# Patient Record
Sex: Female | Born: 1935 | Race: White | Hispanic: No | Smoking: Former smoker
Health system: Southern US, Community
[De-identification: ages and names within clinical notes are randomized; demographics above are authoritative.]

## PROBLEM LIST (undated history)

## (undated) ENCOUNTER — Emergency Department (HOSPITAL_BASED_OUTPATIENT_CLINIC_OR_DEPARTMENT_OTHER): Admission: EM | Payer: Medicare Other

## (undated) DIAGNOSIS — K2970 Gastritis, unspecified, without bleeding: Secondary | ICD-10-CM

## (undated) DIAGNOSIS — I639 Cerebral infarction, unspecified: Secondary | ICD-10-CM

## (undated) DIAGNOSIS — K648 Other hemorrhoids: Secondary | ICD-10-CM

## (undated) DIAGNOSIS — K635 Polyp of colon: Secondary | ICD-10-CM

## (undated) DIAGNOSIS — F32A Depression, unspecified: Secondary | ICD-10-CM

## (undated) DIAGNOSIS — T7840XA Allergy, unspecified, initial encounter: Secondary | ICD-10-CM

## (undated) DIAGNOSIS — M1990 Unspecified osteoarthritis, unspecified site: Secondary | ICD-10-CM

## (undated) DIAGNOSIS — K210 Gastro-esophageal reflux disease with esophagitis: Secondary | ICD-10-CM

## (undated) DIAGNOSIS — F329 Major depressive disorder, single episode, unspecified: Secondary | ICD-10-CM

## (undated) DIAGNOSIS — C801 Malignant (primary) neoplasm, unspecified: Secondary | ICD-10-CM

## (undated) DIAGNOSIS — K317 Polyp of stomach and duodenum: Secondary | ICD-10-CM

## (undated) DIAGNOSIS — K589 Irritable bowel syndrome without diarrhea: Secondary | ICD-10-CM

## (undated) DIAGNOSIS — G459 Transient cerebral ischemic attack, unspecified: Secondary | ICD-10-CM

## (undated) DIAGNOSIS — K219 Gastro-esophageal reflux disease without esophagitis: Secondary | ICD-10-CM

## (undated) DIAGNOSIS — Z5189 Encounter for other specified aftercare: Secondary | ICD-10-CM

## (undated) DIAGNOSIS — K2960 Other gastritis without bleeding: Secondary | ICD-10-CM

## (undated) HISTORY — PX: TOTAL HIP ARTHROPLASTY: SHX124

## (undated) HISTORY — DX: Irritable bowel syndrome, unspecified: K58.9

## (undated) HISTORY — DX: Gastritis, unspecified, without bleeding: K29.70

## (undated) HISTORY — DX: Transient cerebral ischemic attack, unspecified: G45.9

## (undated) HISTORY — DX: Other gastritis without bleeding: K29.60

## (undated) HISTORY — DX: Depression, unspecified: F32.A

## (undated) HISTORY — DX: Allergy, unspecified, initial encounter: T78.40XA

## (undated) HISTORY — PX: COLONOSCOPY: SHX174

## (undated) HISTORY — DX: Unspecified osteoarthritis, unspecified site: M19.90

## (undated) HISTORY — DX: Malignant (primary) neoplasm, unspecified: C80.1

## (undated) HISTORY — PX: APPENDECTOMY: SHX54

## (undated) HISTORY — DX: Gastro-esophageal reflux disease without esophagitis: K21.9

## (undated) HISTORY — DX: Polyp of colon: K63.5

## (undated) HISTORY — PX: ESOPHAGOGASTRODUODENOSCOPY: SHX1529

## (undated) HISTORY — DX: Polyp of stomach and duodenum: K31.7

## (undated) HISTORY — DX: Gastro-esophageal reflux disease with esophagitis: K21.0

## (undated) HISTORY — DX: Other hemorrhoids: K64.8

## (undated) HISTORY — DX: Major depressive disorder, single episode, unspecified: F32.9

## (undated) HISTORY — DX: Encounter for other specified aftercare: Z51.89

---

## 1998-03-10 ENCOUNTER — Encounter: Payer: Self-pay | Admitting: Orthopedic Surgery

## 1998-03-12 ENCOUNTER — Inpatient Hospital Stay (HOSPITAL_COMMUNITY): Admission: RE | Admit: 1998-03-12 | Discharge: 1998-03-17 | Payer: Self-pay | Admitting: Orthopedic Surgery

## 1998-03-12 ENCOUNTER — Encounter: Payer: Self-pay | Admitting: Orthopedic Surgery

## 1998-03-17 ENCOUNTER — Inpatient Hospital Stay (HOSPITAL_COMMUNITY)
Admission: RE | Admit: 1998-03-17 | Discharge: 1998-03-24 | Payer: Self-pay | Admitting: Physical Medicine and Rehabilitation

## 1998-03-17 ENCOUNTER — Encounter: Payer: Self-pay | Admitting: Physical Medicine and Rehabilitation

## 1998-03-18 ENCOUNTER — Encounter: Payer: Self-pay | Admitting: Physical Medicine and Rehabilitation

## 1998-03-21 ENCOUNTER — Encounter: Payer: Self-pay | Admitting: Physical Medicine and Rehabilitation

## 1998-05-08 ENCOUNTER — Encounter: Admission: RE | Admit: 1998-05-08 | Discharge: 1998-08-06 | Payer: Self-pay | Admitting: Orthopedic Surgery

## 1998-06-17 ENCOUNTER — Ambulatory Visit (HOSPITAL_COMMUNITY): Admission: RE | Admit: 1998-06-17 | Discharge: 1998-06-17 | Payer: Self-pay | Admitting: Cardiology

## 1998-06-17 ENCOUNTER — Encounter: Payer: Self-pay | Admitting: Cardiology

## 1998-06-25 ENCOUNTER — Inpatient Hospital Stay (HOSPITAL_COMMUNITY): Admission: RE | Admit: 1998-06-25 | Discharge: 1998-06-28 | Payer: Self-pay | Admitting: Orthopedic Surgery

## 1998-06-25 ENCOUNTER — Encounter: Payer: Self-pay | Admitting: Orthopedic Surgery

## 1998-06-28 ENCOUNTER — Inpatient Hospital Stay (HOSPITAL_COMMUNITY)
Admission: RE | Admit: 1998-06-28 | Discharge: 1998-07-04 | Payer: Self-pay | Admitting: Physical Medicine & Rehabilitation

## 1998-07-07 ENCOUNTER — Encounter
Admission: RE | Admit: 1998-07-07 | Discharge: 1998-09-04 | Payer: Self-pay | Admitting: Physical Medicine & Rehabilitation

## 1999-06-22 ENCOUNTER — Encounter: Payer: Self-pay | Admitting: Cardiology

## 1999-06-22 ENCOUNTER — Ambulatory Visit (HOSPITAL_COMMUNITY): Admission: RE | Admit: 1999-06-22 | Discharge: 1999-06-22 | Payer: Self-pay | Admitting: Cardiology

## 2001-03-01 DIAGNOSIS — K2950 Unspecified chronic gastritis without bleeding: Secondary | ICD-10-CM

## 2001-03-01 DIAGNOSIS — K2960 Other gastritis without bleeding: Secondary | ICD-10-CM

## 2001-03-01 HISTORY — DX: Other gastritis without bleeding: K29.60

## 2001-03-01 HISTORY — DX: Unspecified chronic gastritis without bleeding: K29.50

## 2001-03-31 ENCOUNTER — Encounter: Payer: Self-pay | Admitting: Internal Medicine

## 2001-03-31 ENCOUNTER — Ambulatory Visit (HOSPITAL_COMMUNITY): Admission: RE | Admit: 2001-03-31 | Discharge: 2001-03-31 | Payer: Self-pay | Admitting: Internal Medicine

## 2001-05-24 ENCOUNTER — Encounter: Admission: RE | Admit: 2001-05-24 | Discharge: 2001-05-24 | Payer: Self-pay | Admitting: Orthopedic Surgery

## 2001-05-24 ENCOUNTER — Encounter: Payer: Self-pay | Admitting: Orthopedic Surgery

## 2001-10-19 ENCOUNTER — Encounter: Admission: RE | Admit: 2001-10-19 | Discharge: 2001-10-19 | Payer: Self-pay | Admitting: Orthopedic Surgery

## 2001-10-19 ENCOUNTER — Encounter: Payer: Self-pay | Admitting: Orthopedic Surgery

## 2002-04-17 ENCOUNTER — Encounter: Payer: Self-pay | Admitting: Internal Medicine

## 2002-04-17 ENCOUNTER — Ambulatory Visit (HOSPITAL_COMMUNITY): Admission: RE | Admit: 2002-04-17 | Discharge: 2002-04-17 | Payer: Self-pay | Admitting: Internal Medicine

## 2003-04-30 ENCOUNTER — Ambulatory Visit (HOSPITAL_COMMUNITY): Admission: RE | Admit: 2003-04-30 | Discharge: 2003-04-30 | Payer: Self-pay | Admitting: *Deleted

## 2004-07-22 ENCOUNTER — Ambulatory Visit (HOSPITAL_COMMUNITY): Admission: RE | Admit: 2004-07-22 | Discharge: 2004-07-22 | Payer: Self-pay | Admitting: Obstetrics and Gynecology

## 2005-01-05 ENCOUNTER — Encounter: Admission: RE | Admit: 2005-01-05 | Discharge: 2005-01-05 | Payer: Self-pay | Admitting: Orthopedic Surgery

## 2005-01-08 ENCOUNTER — Encounter: Admission: RE | Admit: 2005-01-08 | Discharge: 2005-01-08 | Payer: Self-pay | Admitting: Orthopedic Surgery

## 2005-08-10 ENCOUNTER — Ambulatory Visit (HOSPITAL_COMMUNITY): Admission: RE | Admit: 2005-08-10 | Discharge: 2005-08-10 | Payer: Self-pay | Admitting: Internal Medicine

## 2006-09-06 ENCOUNTER — Ambulatory Visit (HOSPITAL_COMMUNITY): Admission: RE | Admit: 2006-09-06 | Discharge: 2006-09-06 | Payer: Self-pay | Admitting: Obstetrics and Gynecology

## 2007-10-05 ENCOUNTER — Ambulatory Visit (HOSPITAL_COMMUNITY): Admission: RE | Admit: 2007-10-05 | Discharge: 2007-10-05 | Payer: Self-pay | Admitting: Obstetrics and Gynecology

## 2008-03-19 ENCOUNTER — Inpatient Hospital Stay (HOSPITAL_COMMUNITY): Admission: AD | Admit: 2008-03-19 | Discharge: 2008-03-20 | Payer: Self-pay | Admitting: Internal Medicine

## 2008-03-19 ENCOUNTER — Ambulatory Visit: Payer: Self-pay | Admitting: Cardiovascular Disease

## 2008-03-20 ENCOUNTER — Ambulatory Visit: Payer: Self-pay | Admitting: Vascular Surgery

## 2008-03-20 ENCOUNTER — Encounter (INDEPENDENT_AMBULATORY_CARE_PROVIDER_SITE_OTHER): Payer: Self-pay | Admitting: Internal Medicine

## 2008-11-07 ENCOUNTER — Ambulatory Visit (HOSPITAL_COMMUNITY): Admission: RE | Admit: 2008-11-07 | Discharge: 2008-11-07 | Payer: Self-pay | Admitting: Internal Medicine

## 2009-03-01 DIAGNOSIS — K2100 Gastro-esophageal reflux disease with esophagitis, without bleeding: Secondary | ICD-10-CM

## 2009-03-01 HISTORY — DX: Gastro-esophageal reflux disease with esophagitis, without bleeding: K21.00

## 2010-03-21 ENCOUNTER — Encounter: Payer: Self-pay | Admitting: Obstetrics and Gynecology

## 2010-04-21 ENCOUNTER — Other Ambulatory Visit (HOSPITAL_COMMUNITY): Payer: Self-pay | Admitting: Internal Medicine

## 2010-04-21 DIAGNOSIS — Z1231 Encounter for screening mammogram for malignant neoplasm of breast: Secondary | ICD-10-CM

## 2010-04-28 ENCOUNTER — Ambulatory Visit (HOSPITAL_COMMUNITY)
Admission: RE | Admit: 2010-04-28 | Discharge: 2010-04-28 | Disposition: A | Discharge status: Home / Self Care | Payer: Medicare Other | Source: Ambulatory Visit | Attending: Internal Medicine | Admitting: Internal Medicine

## 2010-04-28 ENCOUNTER — Encounter (HOSPITAL_COMMUNITY): Payer: Self-pay

## 2010-04-28 DIAGNOSIS — Z1231 Encounter for screening mammogram for malignant neoplasm of breast: Secondary | ICD-10-CM | POA: Insufficient documentation

## 2010-06-15 LAB — COMPREHENSIVE METABOLIC PANEL
Albumin: 4.1 g/dL (ref 3.5–5.2)
Alkaline Phosphatase: 43 U/L (ref 39–117)
BUN: 9 mg/dL (ref 6–23)
CO2: 27 mEq/L (ref 19–32)
Chloride: 101 mEq/L (ref 96–112)
GFR calc non Af Amer: >60 mL/min (ref >60)
Glucose, Bld: 132 mg/dL — ABNORMAL HIGH (ref 70–99)
Potassium: 3.3 mEq/L — ABNORMAL LOW (ref 3.5–5.1)
Total Bilirubin: 0.7 mg/dL (ref 0.3–1.2)

## 2010-06-15 LAB — CBC
HCT: 36 % (ref 36.0–46.0)
Hemoglobin: 12.4 g/dL (ref 12.0–15.0)
RBC: 3.5 MIL/uL — ABNORMAL LOW (ref 3.87–5.11)
WBC: 4.4 10*3/uL (ref 4.0–10.5)

## 2010-06-15 LAB — PROTIME-INR: Prothrombin Time: 13.8 seconds (ref 11.6–15.2)

## 2010-06-15 LAB — LIPID PANEL
Cholesterol: 190 mg/dL (ref 0–200)
HDL: 88 mg/dL (ref >39)
Total CHOL/HDL Ratio: 2.2 RATIO

## 2010-06-15 LAB — DIFFERENTIAL
Basophils Absolute: 0 10*3/uL (ref 0.0–0.1)
Basophils Relative: 1 % (ref 0–1)
Monocytes Absolute: 0.4 10*3/uL (ref 0.1–1.0)
Neutro Abs: 2.6 10*3/uL (ref 1.7–7.7)
Neutrophils Relative %: 58 % (ref 43–77)

## 2010-07-14 NOTE — Consult Note (Signed)
Donna Cummings, Donna Cummings               ACCOUNT NO.:  [id number]   MEDICAL RECORD NO.:  [id number]          PATIENT TYPE:  INP   LOCATION:  3707                         FACILITY:  [facility]   PHYSICIAN:  Melvyn Novas, M.D.  DATE OF BIRTH:  09-Apr-1930   DATE OF CONSULTATION:  03/19/2008  DATE OF DISCHARGE:  03/20/2008                                 CONSULTATION   REFERRING PHYSICIAN:  Mark A. Perini, MD   The patient presents with a sudden onset of left arm weakness, left hand  grip strength loss, and a feeling of numbness throughout the left upper  extremity.   Donna Cummings is a 77 year old Caucasian right-handed female, who was in  her usual state of health until the morning hours of March 18, 2008.  She states that she was dozing off in a comfortable share.  Once she  tried to arise, she noticed that her left hand had no strength.  She distinctly reported that she could not fist her hand, could not hold  anything, and could not lift any object.  This finding was isolated.  She states that there may have been some numbness, but this numbness has  meanwhile resolved, and the weakness is still present.  She was seen in  an outpatient setting by Dr. Waynard Edwards, and he arranged for her to be  admitted as a stroke.  Again, the patient's symptoms are now present for almost 24 hours and  she would not be considered a TIA.   PAST MEDICAL HISTORY:  Based on Dr. Laurey Morale notes.  Colon polyps,  hemorrhoids, constipation, appendectomy, bilateral hip replacements in  2000, 2 pregnancies with normal vaginal deliveries.  No other  complications.  History of gastric ulcer, nonmelanotic skin cancer  diagnosed in 2002, irritable bowel syndrome, lumbar spinal stenosis,  COPD, history of iron-deficiency anemia.   ALLERGIES:  Allergic to PENICILLIN, PREVACID caused diarrhea.   CURRENT MEDICATIONS:  Maxzide p.r.n., multivitamin with iron daily,  calcium with vitamin D daily, vitamin C supplement daily,  over-the-  counter laxatives and Maalox, Prilosec Boniva, and vitamin D.   SOCIAL HISTORY:  The patient is widowed since 1972, have 2 adult sons  and 5 grandchildren.  She has a Master's degree.  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  The patient quits tobacco  use in 1980.  She endorsed about 2 glasses of wine pro dia.  No illegal drug use.   FAMILY HISTORY:  Father died at age 48.  Mother died at 30 and has  dementia, had possibly 1 or 2 strokes.  Her sons are healthy.  Her  sister is at present at the bedside during this interview and also  reports no medical problems in the mutual family members.   PHYSICAL EXAMINATION:  VITAL SIGNS:  The patient's weight is 91 pounds,  blood pressure is 120/60, and pulse is 60.  GENERAL:  She is slender, well groomed, in no acute distress.  NEUROLOGIC:  She ambulates normally.  She speaks clear and fluent.  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  The patient, however, reports no diplopia  and this may have been a compensated lesion for much of her life.  Motor  examination clearly shows left-sided pronator drift, dysmetria on finger-  nose-finger exam, decreased grip strength, decreased elbow flexion  strength, absence of deep tendon reflexes including triceps and biceps.  The patient's left shoulder is droopy.  Lower extremities show no  abnormality, but on the reflex exam, there is an equivocal response to  Babinski stimulation on left and a downgoing response on the right.  The  patient reports preserved sensation to fine touch, filament touch, and  pinprick.  No difference between left and right upper extremities.   ASSESSMENT AND PLAN:  The patient has an isolated left hand weakness,  had some pronator drift and  dysmetria on the left, a possible right  brain stroke likely of a small vessel origin has to be worked up.  I do  not suspect that this patient has a peripheral nerve involvement.  I will also mention the ocular motor differences left verus right eye  with lateral gaze to the left.  An MRI was ordered for tonight and was  pending at the time of my exam.  Again, I expect a very small vascular lesion.  Due to the patient's age,  a demyelinating lesion is much less likely and if Dr. Waynard Edwards agrees, I  would like her to take an enteric-coated baby aspirin daily.  We will check homocysteine levels and fastening lipid profile, .   Dr. Waynard Edwards has arranged for vascular evaluation that includes carotid  duplex studies.  No bruit was noted.       Melvyn Novas, M.D.  Electronically Signed     CD/MEDQ  D:  03/21/2008  T:  03/22/2008  Job:  [id number]

## 2010-07-14 NOTE — H&P (Signed)
NAMEAAVA, DELAND               ACCOUNT NO.:  [id number]   MEDICAL RECORD NO.:  [id number]          PATIENT TYPE:  INP   LOCATION:  3707                         FACILITY:  [facility]   PHYSICIAN:  Mark A. Perini, M.D.   DATE OF BIRTH:  [birth year]   DATE OF ADMISSION:  03/19/2008  DATE OF DISCHARGE:                              HISTORY & PHYSICAL   CHIEF COMPLAINT:  Left arm weakness, left hand weakness.   HISTORY OF PRESENT ILLNESS:  Donna Cummings is a pleasant 77 year old female with  past medical history as listed below.  She was in her usual state of  health until sometime yesterday evening when she was sitting and  reading.  She dozed off for maybe just a couple minutes and when she  woke up her left hand had no strength whatsoever.  She could not make a  fist.  She could not hold things.  She dropped a few things.  She  reports no other stroke symptoms, specifically no speech problems, no  visual problems and no other weakness.  The weakness in her hand has  persisted into today.  She was seen in the office and she is admitted  for evaluation for possible right brain stroke.   PAST MEDICAL HISTORY:  1. Bilateral hip replacement in the year 2000 for osteoarthritis.  2. Colon polyps.  3. Internal hemorrhoids.  4. Constipation.  5. Appendectomy.  6. Postmenopausal since her  early 31s.  7. G2, P2 parity status with both normal vaginal deliveries.  8. Gastric ulcer and gastritis diagnosed in the past.  She has had      nonmelanoma skin cancer diagnosed in 2002.  She has known lumbar      spinal stenosis, irritable bowel syndrome,  COPD noted on plain x-      ray but no clinical diagnosis of COPD and she also has      osteoporosis,  nodal osteoarthritis, anemia and gastroesophageal      reflux.  She took hormone therapy from 1988 until approximately 4      months ago when she discontinued this.   ALLERGIES:  PENICILLIN, NEXIUM CAUSES SIDE EFFECTS AND PREVACID CAUSED  DIARRHEA.   CURRENT MEDICATIONS:  As needed Maxzide; she only takes this once or  twice a month,  multivitamin with iron daily, calcium with D daily,  vitamin C supplement daily, Shaklee Herb-Lax as needed,  Maalox as  needed, Vagifem vaginal suppository twice a week, Prilosec 20 mg daily,  Boniva 150 mg once a month and vitamin D 1000 units daily.   SOCIAL HISTORY:  She had been widowed since 1979.  She is two sons and  five grandsons.  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  She quit tobacco in 1980 after approximately a 30 pack-year  smoking history.  She has about two glasses of wine a night and no drug  use.   FAMILY HISTORY:  Father died at age 70 of an MI.  Mother died at 33 of  stomach problems and diverticulosis and dementia.  She has two older  sisters, one has had breast cancer and one has had uterine cancer.  Her  sons are healthy.   REVIEW OF SYSTEMS:  As per the history present illness.  She has had no  recent fevers.  She denies any paresthesias or pain or numbness in the  affected hand.   PHYSICAL EXAMINATION:  Weight 91 pounds which is stable.  Blood pressure  118/60, pulse 60.  She is in no acute distress and ambulates normally  and speaks normally.  Cranial nerves II-XII are normal.  LUNGS:  Clear to auscultation bilaterally with no wheezes, rales or  rhonchi.  HEART:  Regular rate and rhythm with no murmur or gallop.  ABDOMEN:  Soft and nontender with no mass or hepatosplenomegaly.  Her  left hand has definite decreased grip strength.  She has trouble with  finger-nose-finger testing on the left due to trouble controlling her  left hand.  She has 1+ deep tendon reflexes in both the upper and lower  extremities bilaterally and  her reflex in her left upper extremity  actually seen slightly less than the right and is certainly not  hyperreflexic.  She has no definite pronator drift and noted.   Laboratory data pending.   ASSESSMENT/PLAN:  Left hand  weakness, possibly right brain stroke versus  some sort of nerve impingement syndrome emanating from the neck or  further down the arm.   We will admit her to [facility].  We will check an MRI of her  brain.  We will obtain a neurology consultation.  We will check carotid  Dopplers, lipid profile an echocardiogram as well.  She is a full code  status.  We will also initiate aspirin therapy.      Mark A. Perini, M.D.  Electronically Signed     MAP/MEDQ  D:  03/19/2008  T:  03/19/2008  Job:  [id number]

## 2010-07-17 NOTE — Discharge Summary (Signed)
Donna Cummings, Donna Cummings               ACCOUNT NO.:  [id number]   MEDICAL RECORD NO.:  [id number]          PATIENT TYPE:  INP   LOCATION:  3707                         FACILITY:  [facility]   PHYSICIAN:  Mark A. Perini, M.D.   DATE OF BIRTH:  1930/10/20   DATE OF ADMISSION:  03/19/2008  DATE OF DISCHARGE:  03/20/2008                               DISCHARGE SUMMARY   DISCHARGE DIAGNOSES:  1. Suspected small motor only right brain stroke with the effect of      left upper extremity weakness.  This was not seen on the MRI, but      this was still the suspected cause of her symptoms.  2. Hypokalemia, replaced.  3. Osteoarthritis.  4. Constipation.  5. Remote history of gastric ulcer.   PROCEDURES:  1. Neurology consultation.  MRI of the brain, which showed no acute      abnormality, and was moderate for age, cerebral white matter signal      abnormality most commonly due to chronic small vessel ischemia,      chronic lacunar infarct versus dilated perivascular space was noted      in the anterior left thalamus.  2. A 2-D echocardiogram, which showed normal systolic function with an      ejection fraction of 60%, mild aortic valve regurgitation, mild      mitral valve regurgitation, mildly-dilated left atrium and right      ventricular size at the upper limits of normal, with trivial      posterior effusion, no source of cardiac embolus.  Carotid Dopplers      were performed, which showed a right 60%-80% stenosis, which may      have simply been a tortuous vessel.   DISCHARGE MEDICATIONS:  1. Multivitamin daily.  2. Calcium with D daily.  3. Vagifem suppository as needed.  4. Prilosec 20 mg daily.  5. Aspirin, which is a new medication, noncoated 325 mg daily.  6. Boniva 150 mg once monthly.  7. Vitamin D 1000 units daily.   HISTORY OF PRESENT ILLNESS:  Donna Cummings is a pleasant 77 year old female who  presented to the office after having 12-16 hours of left upper extremity  weakness.  She  noted that she could not hold things and she dropped  several things.  She had no other stroke symptoms.  Due to these  symptoms, she was admitted for evaluation of possible right brain  stroke.   HOSPITAL COURSE:  Donna Cummings was admitted to a telemetry bed.  She had no  arrhythmias noted.  She remained stable from a cardiovascular standpoint  and she had no neurologic worsening.  Her left upper extremity weakness  did improve somewhat during her hospital stay, but was not entirely  resolved.  At the time of discharge, it was felt that she had had a  small right brain motor only infarcts, but simply was too small to show  up on her MRI.  On March 20, 2008, she was discharged home in stable  condition.   DISCHARGE PHYSICAL EXAMINATION:  VITAL SIGNS:  She was afebrile.  Pulse  51, respiratory rate 18, blood pressure 107/55, weight 40.2 kg.  GENERAL:  She was in no acute distress, alert, oriented x4.  LUNGS:  Clear to auscultation bilaterally with no wheezes, rales, or  rhonchi.  HEART:  Regular rate and rhythm with no murmur, rub, or gallop.  ABDOMEN:  Soft, nontender, and nondistended with no mass or  hepatosplenomegaly.  EXTREMITIES:  Left hand was still somewhat weak, but stronger than when  she had upon admission.   DISCHARGE LABORATORY DATA:  Cholesterol 190, triglycerides 31, HDL 88,  LDL 96.  TSH normal at 1.32.  Phosphorus and magnesium were normal.  Potassium was 3.3 on admission and this was replaced.  Liver tests were  normal on admission as were coagulation studies, and her complete blood  count was essentially normal.   DISCHARGE INSTRUCTIONS:  Donna Cummings is to resume her normal activities.  She  is to follow a low-salt, heart-healthy diet.  She is to call if she has  any recurrent problems.  We will set up outpatient occupational therapy  for her left upper extremity weakness.  She is to stay off of her  hormone patch, which she was discontinued several months ago, anyway.  She  will follow up in our office in 2 weeks.      Mark A. Perini, M.D.  Electronically Signed     MAP/MEDQ  D:  04/05/2008  T:  04/06/2008  Job:  [id number]

## 2011-03-12 DIAGNOSIS — D485 Neoplasm of uncertain behavior of skin: Secondary | ICD-10-CM | POA: Diagnosis not present

## 2011-03-12 DIAGNOSIS — L570 Actinic keratosis: Secondary | ICD-10-CM | POA: Diagnosis not present

## 2011-04-30 ENCOUNTER — Encounter: Payer: Self-pay | Admitting: Internal Medicine

## 2011-05-12 ENCOUNTER — Other Ambulatory Visit (HOSPITAL_COMMUNITY): Payer: Self-pay | Admitting: Internal Medicine

## 2011-05-12 DIAGNOSIS — Z1231 Encounter for screening mammogram for malignant neoplasm of breast: Secondary | ICD-10-CM

## 2011-05-18 ENCOUNTER — Ambulatory Visit: Payer: Medicare Other | Admitting: Internal Medicine

## 2011-05-25 ENCOUNTER — Ambulatory Visit (HOSPITAL_COMMUNITY): Payer: Medicare Other

## 2011-05-25 ENCOUNTER — Ambulatory Visit (HOSPITAL_COMMUNITY)
Admission: RE | Admit: 2011-05-25 | Discharge: 2011-05-25 | Disposition: A | Discharge status: Home / Self Care | Payer: Medicare Other | Source: Ambulatory Visit | Attending: Internal Medicine | Admitting: Internal Medicine

## 2011-05-25 DIAGNOSIS — Z1231 Encounter for screening mammogram for malignant neoplasm of breast: Secondary | ICD-10-CM | POA: Insufficient documentation

## 2011-06-10 ENCOUNTER — Telehealth: Payer: Self-pay | Admitting: Internal Medicine

## 2011-06-10 NOTE — Telephone Encounter (Signed)
Received copies from [facility] 06/10/11 . Forwarded  11 pages to Dr. Leone Payor ,for review.

## 2011-06-11 ENCOUNTER — Ambulatory Visit (INDEPENDENT_AMBULATORY_CARE_PROVIDER_SITE_OTHER): Payer: Medicare Other | Admitting: Internal Medicine

## 2011-06-11 ENCOUNTER — Encounter: Payer: Self-pay | Admitting: Internal Medicine

## 2011-06-11 DIAGNOSIS — R198 Other specified symptoms and signs involving the digestive system and abdomen: Secondary | ICD-10-CM | POA: Diagnosis not present

## 2011-06-11 DIAGNOSIS — K589 Irritable bowel syndrome without diarrhea: Secondary | ICD-10-CM | POA: Diagnosis not present

## 2011-06-11 MED ORDER — PEG-KCL-NACL-NASULF-NA ASC-C 100 G PO SOLR: 1.0000 | Freq: Once | ORAL | Status: DC

## 2011-06-11 NOTE — Progress Notes (Signed)
Subjective:    Patient ID: Donna Cummings, female    DOB: 1931/01/30, 78 y.o.   MRN: [id number]  Referring MD: Dr. Rodrigo Ran  HPI This is a delightful 78 year old widowed white woman who presents for evaluation of change in bowel habits. She reports she was moving her bowels regularly though there is some history of alternating loose and hard stools. She does carry a diagnosis of IBS and had previously been receiving gastroenterology care by Dr. Jodi Marble in [place], [place]. She resides in [place] but her son lives in that area so she used to pursue care there. Over decades she has received multiple upper endoscopy and colonoscopy procedures, there is a remote history of colon polyps though none since the early 1990s. Her last colonoscopy was in 2005. He had internal hemorrhoids but no other abnormalities. He had recommended a routine repeat colonoscopy 5 years after that, she did not pursue that because of the TIA around that time it sounds like. She also had problems with bloating and abdominal distention and gastrointestinal upset in the interim and was placed on a proton with success and is still on that. Despite that she has developed decreasing stool caliber and decreasing ability to produce a stool over the past few months. She had been using an Herblax with senna on an intermittent basis but is now taking it everyday to produce regular bowel movements  She thinks there may have been about a 5 pound weight loss associated with that. I do no going back to 2011, she had a weight of 90 pounds. She has had negative biopsies for celiac disease of the duodenum, and I believe celiac panel has been negative in the past though I do not have that information she has not been told she has celiac disease.  She denies any rectal bleeding. She eats quite a bit of fruit and serial. She stays hungry.  Bleeding reported. GI review of systems otherwise negative.  Medications, allergies, past medical  history, past surgical history, family history and social history are reviewed and updated in the EMR.   Review of Systems She traumatized her left posterior calf on her car door and caused abrasion that my nursing staff bandage today. There was some bleeding associated with that. She says it's okay now. She wears eyeglasses. She sleeps well without difficulty. Her energy level is good. 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 She feels like she bleeds easily on aspirin. All other review of systems negative or as per history of present illness.    Objective:   Physical Exam General:  Thin, elderly and in no acute distress Eyes:  anicteric. ENT:   Mouth and posterior pharynx free of lesions.  Neck:   supple w/o thyromegaly or mass.  Lungs: Clear to auscultation bilaterally. Heart:  S1S2, no rubs, murmurs, gallops. Abdomen:  soft, non-tender, no hepatosplenomegaly, hernia, or mass and BS+.  Rectal: Deferred to colonoscopy Lymph:  no cervical or supraclavicular adenopathy. Extremities:   no edema Skin   no rash. Some senile purpura changes in the forearms. The posterior left calf is bandaged. Neuro:  A&O x 3.  Psych:  appropriate mood and  Affect.   Data Reviewed: Previous upper endoscopy, colonoscopy, radiology reports, pathology reports and office notes from prior gastroenterologist. These will be scanned into the electronic medical record.       Assessment & Plan:   1. Change in bowel function   He has a decrease in stool caliber and difficulty  producing a bowel movement that is new. Colonoscopy is appropriate. The risks and benefits as well as alternatives of endoscopic procedure(s) have been discussed and reviewed. All questions answered. The patient agrees to proceed. It will be scheduled at her convenience. She is concerned because she feels like she bleeds easily on aspirin so I will hold her aspirin   2. IBS (irritable bowel syndrome)   She does have a background  of this, it could be the cause of her symptoms but more serious problems need to be excluded.    CC: Ezequiel Kayser, MD

## 2011-06-11 NOTE — Patient Instructions (Signed)
You have been scheduled for a colonoscopy. Please follow written instructions given to you at your visit today.  Please pick up your prep kit at the pharmacy within the next 1-3 days.  Keep your left leg bandaged and follow-up with Dr. Rodrigo Ran about this injury.  Hold your Asprin one week prior to your colonoscopy.

## 2011-06-29 DIAGNOSIS — H04209 Unspecified epiphora, unspecified lacrimal gland: Secondary | ICD-10-CM | POA: Diagnosis not present

## 2011-06-29 DIAGNOSIS — H04129 Dry eye syndrome of unspecified lacrimal gland: Secondary | ICD-10-CM | POA: Diagnosis not present

## 2011-06-29 DIAGNOSIS — H0289 Other specified disorders of eyelid: Secondary | ICD-10-CM | POA: Diagnosis not present

## 2011-07-01 ENCOUNTER — Other Ambulatory Visit: Payer: Medicare Other | Admitting: Internal Medicine

## 2011-07-05 ENCOUNTER — Ambulatory Visit (AMBULATORY_SURGERY_CENTER): Payer: Medicare Other | Admitting: Internal Medicine

## 2011-07-05 ENCOUNTER — Encounter: Payer: Self-pay | Admitting: Internal Medicine

## 2011-07-05 VITALS — BP 115/65 | HR 75 | Temp 97.1°F | Resp 14 | Ht 63.0 in | Wt 86.0 lb

## 2011-07-05 DIAGNOSIS — R198 Other specified symptoms and signs involving the digestive system and abdomen: Secondary | ICD-10-CM | POA: Diagnosis not present

## 2011-07-05 DIAGNOSIS — K644 Residual hemorrhoidal skin tags: Secondary | ICD-10-CM

## 2011-07-05 DIAGNOSIS — D126 Benign neoplasm of colon, unspecified: Secondary | ICD-10-CM

## 2011-07-05 DIAGNOSIS — K648 Other hemorrhoids: Secondary | ICD-10-CM

## 2011-07-05 MED ORDER — SODIUM CHLORIDE 0.9 % IV SOLN: 500.0000 mL | INTRAVENOUS | Status: DC

## 2011-07-05 NOTE — Op Note (Signed)
[facility] [address] [place], [place]  [address]  COLONOSCOPY PROCEDURE REPORT  PATIENT:  Donna Cummings, Donna Cummings  MR#:  [id number] BIRTHDATE:  Oct 04, 1930, 81 yrs. old  GENDER:  female ENDOSCOPIST:  Iva Boop, MD, [facility] REF. BY:  Rodrigo Ran, M.D. PROCEDURE DATE:  07/05/2011 PROCEDURE:  Colonoscopy with snare polypectomy ASA CLASS:  Class II INDICATIONS:  change in bowel habits MEDICATIONS:   These medications were titrated to patient response per physician's verbal order, Fentanyl 50 mcg IV, Versed 5 mg IV  DESCRIPTION OF PROCEDURE:   After the risks benefits and alternatives of the procedure were thoroughly explained, informed consent was obtained.  Digital rectal exam was performed and revealed no rectal masses.  Anal tag present. The LB [id number]-[id number] endoscope was introduced through the anus and advanced to the cecum, which was identified by both the appendix and ileocecal valve, without limitations.  The quality of the prep was good, using MoviPrep.  The instrument was then slowly withdrawn as the colon was fully examined. <<PROCEDUREIMAGES>>  FINDINGS:  A sessile polyp was found in the ascending colon. It was 1 cm in size. Polyp was snared, then cauterized with monopolar cautery. Retrieval was successful.  This was otherwise a normal examination of the colon.   Retroflexed views in the rectum revealed no abnormalities.    The time to cecum = 7:36 minutes. The scope was then withdrawn in 13:10 minutes from the cecum and the procedure completed. COMPLICATIONS:  None ENDOSCOPIC IMPRESSION: 1) 1 cm sessile polyp in the ascending colon - removed 2) Otherwise normal examination with good prep - suspect IBS/spasm cause of bowel habit changes RECOMMENDATIONS: 1) Hold aspirin, aspirin products, and anti-inflammatory medication for 2 weeks. 2) High fiber diet. 3) Add psyllium 1 tablespooon daily if dietary changes unhelpful  4) Follow-up GI as needed REPEAT  EXAM:  May not need routine repeat colonoscopy given her age - await pathology review.  Iva Boop, MD, [facility]  CC:  Rodrigo Ran, MD and The Patient  n. eSIGNED:   Iva Boop at 07/05/2011 10:19 AM  Marjo Bicker, [phone]

## 2011-07-05 NOTE — Progress Notes (Signed)
Pressure applied to abdomen to reach cecum.  

## 2011-07-05 NOTE — Patient Instructions (Signed)
Hold aspirin and aspirin products for 2 weeks.  Hold until Jul 19, 2011.  Resume your other medications today.  Handouts were given on polyps and high fiber diet.  Please call if any questions or concerns.    YOU HAD AN ENDOSCOPIC PROCEDURE TODAY AT THE [facility]: Refer to the procedure report that was given to you for any specific questions about what was found during the examination.  If the procedure report does not answer your questions, please call your gastroenterologist to clarify.  If you requested that your care partner not be given the details of your procedure findings, then the procedure report has been included in a sealed envelope for you to review at your convenience later.  YOU SHOULD EXPECT: Some feelings of bloating in the abdomen. Passage of more gas than usual.  Walking can help get rid of the air that was put into your GI tract during the procedure and reduce the bloating. If you had a lower endoscopy (such as a colonoscopy or flexible sigmoidoscopy) you may notice spotting of blood in your stool or on the toilet paper. If you underwent a bowel prep for your procedure, then you may not have a normal bowel movement for a few days.  DIET: Your first meal following the procedure should be a light meal and then it is ok to progress to your normal diet.  A half-sandwich or bowl of soup is an example of a good first meal.  Heavy or fried foods are harder to digest and may make you feel nauseous or bloated.  Likewise meals heavy in dairy and vegetables can cause extra gas to form and this can also increase the bloating.  Drink plenty of fluids but you should avoid alcoholic beverages for 24 hours.  ACTIVITY: Your care partner should take you home directly after the procedure.  You should plan to take it easy, moving slowly for the rest of the day.  You can resume normal activity the day after the procedure however you should NOT DRIVE or use heavy machinery for 24 hours (because  of the sedation medicines used during the test).    SYMPTOMS TO REPORT IMMEDIATELY: A gastroenterologist can be reached at any hour.  During normal business hours, 8:30 AM to 5:00 PM Monday through Friday, call [phone].  After hours and on weekends, please call the GI answering service at [phone] who will take a message and have the physician on call contact you.   Following lower endoscopy (colonoscopy or flexible sigmoidoscopy):  Excessive amounts of blood in the stool  Significant tenderness or worsening of abdominal pains  Swelling of the abdomen that is new, acute  Fever of 100F or higher    FOLLOW UP: If any biopsies were taken you will be contacted by phone or by letter within the next 1-3 weeks.  Call your gastroenterologist if you have not heard about the biopsies in 3 weeks.  Our staff will call the home number listed on your records the next business day following your procedure to check on you and address any questions or concerns that you may have at that time regarding the information given to you following your procedure. This is a courtesy call and so if there is no answer at the home number and we have not heard from you through the emergency physician on call, we will assume that you have returned to your regular daily activities without incident.  SIGNATURES/CONFIDENTIALITY: You and/or your care partner  have signed paperwork which will be entered into your electronic medical record.  These signatures attest to the fact that that the information above on your After Visit Summary has been reviewed and is understood.  Full responsibility of the confidentiality of this discharge information lies with you and/or your care-partner.

## 2011-07-05 NOTE — Progress Notes (Signed)
No complaints noted in the recovery room. Maw  Patient did not experience any of the following events: a burn prior to discharge; a fall within the facility; wrong site/side/patient/procedure/implant event; or a hospital transfer or hospital admission upon discharge from the facility. (G8907) Patient did not have preoperative order for IV antibiotic SSI prophylaxis. ([id number])  

## 2011-07-05 NOTE — Progress Notes (Signed)
Pt states that she bleeds freely due to the ASA she takes daily. ewm

## 2011-07-06 ENCOUNTER — Telehealth: Payer: Self-pay

## 2011-07-06 NOTE — Telephone Encounter (Signed)
Left message

## 2011-07-08 ENCOUNTER — Encounter: Payer: Self-pay | Admitting: Internal Medicine

## 2011-07-08 DIAGNOSIS — Z860100 Personal history of colon polyps, unspecified: Secondary | ICD-10-CM | POA: Insufficient documentation

## 2011-07-08 NOTE — Progress Notes (Signed)
Quick Note:  1 cm sessile serrated adenoma Consider repeat colonoscopy in 3 years (recall 06/2014) ______

## 2011-08-05 DIAGNOSIS — M810 Age-related osteoporosis without current pathological fracture: Secondary | ICD-10-CM | POA: Diagnosis not present

## 2011-08-05 DIAGNOSIS — Z0000 Encounter for general adult medical examination without abnormal findings: Secondary | ICD-10-CM | POA: Diagnosis not present

## 2011-08-05 DIAGNOSIS — E785 Hyperlipidemia, unspecified: Secondary | ICD-10-CM | POA: Diagnosis not present

## 2011-08-11 DIAGNOSIS — Z124 Encounter for screening for malignant neoplasm of cervix: Secondary | ICD-10-CM | POA: Diagnosis not present

## 2011-08-11 DIAGNOSIS — H6120 Impacted cerumen, unspecified ear: Secondary | ICD-10-CM | POA: Diagnosis not present

## 2011-08-11 DIAGNOSIS — E785 Hyperlipidemia, unspecified: Secondary | ICD-10-CM | POA: Diagnosis not present

## 2011-08-11 DIAGNOSIS — Z0000 Encounter for general adult medical examination without abnormal findings: Secondary | ICD-10-CM | POA: Diagnosis not present

## 2011-08-11 DIAGNOSIS — J449 Chronic obstructive pulmonary disease, unspecified: Secondary | ICD-10-CM | POA: Diagnosis not present

## 2011-08-19 ENCOUNTER — Other Ambulatory Visit: Payer: Medicare Other

## 2011-08-24 ENCOUNTER — Ambulatory Visit (INDEPENDENT_AMBULATORY_CARE_PROVIDER_SITE_OTHER): Payer: BLUE CROSS/BLUE SHIELD | Admitting: *Deleted

## 2011-08-24 DIAGNOSIS — I6529 Occlusion and stenosis of unspecified carotid artery: Secondary | ICD-10-CM | POA: Diagnosis not present

## 2011-08-27 NOTE — Procedures (Unsigned)
CAROTID DUPLEX EXAM  INDICATION:  Carotid stenosis.  HISTORY: Diabetes:  No. Cardiac:  No. Hypertension:  Unknown. Smoking:  No. Previous Surgery:  No. CV History:  History of TIA. Amaurosis Fugax No, Paresthesias No, Hemiparesis No                                      RIGHT             LEFT Brachial systolic pressure:         108               110 Brachial Doppler waveforms:         Normal            Normal Vertebral direction of flow:        Antegrade         Antegrade DUPLEX VELOCITIES (cm/sec) CCA peak systolic                   86                80 ECA peak systolic                   67                58 ICA peak systolic                   87                70 ICA end diastolic                   26                20 PLAQUE MORPHOLOGY:                  Intimal wall thickening             Heterogenous PLAQUE AMOUNT:                      Minimal           Minimal PLAQUE LOCATION:                    CCA, bifurcation  ECA  IMPRESSION: 1. No hemodynamically stenosis of the right or left internal carotid     artery. 2. Bilateral vertebral arteries are antegrade.  ___________________________________________ Janetta Hora. Fields, MD  EM/MEDQ  D:  08/24/2011  T:  08/24/2011  Job:  [id number]

## 2011-09-07 DIAGNOSIS — M899 Disorder of bone, unspecified: Secondary | ICD-10-CM | POA: Diagnosis not present

## 2011-09-07 DIAGNOSIS — M949 Disorder of cartilage, unspecified: Secondary | ICD-10-CM | POA: Diagnosis not present

## 2011-09-16 ENCOUNTER — Other Ambulatory Visit: Payer: Self-pay | Admitting: Dermatology

## 2011-09-16 DIAGNOSIS — C44519 Basal cell carcinoma of skin of other part of trunk: Secondary | ICD-10-CM | POA: Diagnosis not present

## 2011-09-16 DIAGNOSIS — L570 Actinic keratosis: Secondary | ICD-10-CM | POA: Diagnosis not present

## 2011-09-16 DIAGNOSIS — C44611 Basal cell carcinoma of skin of unspecified upper limb, including shoulder: Secondary | ICD-10-CM | POA: Diagnosis not present

## 2011-09-16 DIAGNOSIS — L821 Other seborrheic keratosis: Secondary | ICD-10-CM | POA: Diagnosis not present

## 2011-09-16 DIAGNOSIS — D485 Neoplasm of uncertain behavior of skin: Secondary | ICD-10-CM | POA: Diagnosis not present

## 2011-09-16 DIAGNOSIS — D1800 Hemangioma unspecified site: Secondary | ICD-10-CM | POA: Diagnosis not present

## 2011-12-04 DIAGNOSIS — Z23 Encounter for immunization: Secondary | ICD-10-CM | POA: Diagnosis not present

## 2011-12-08 DIAGNOSIS — H2510 Age-related nuclear cataract, unspecified eye: Secondary | ICD-10-CM | POA: Diagnosis not present

## 2011-12-08 DIAGNOSIS — H04129 Dry eye syndrome of unspecified lacrimal gland: Secondary | ICD-10-CM | POA: Diagnosis not present

## 2011-12-08 DIAGNOSIS — H268 Other specified cataract: Secondary | ICD-10-CM | POA: Diagnosis not present

## 2012-03-27 DIAGNOSIS — R079 Chest pain, unspecified: Secondary | ICD-10-CM | POA: Diagnosis not present

## 2012-03-27 DIAGNOSIS — S298XXA Other specified injuries of thorax, initial encounter: Secondary | ICD-10-CM | POA: Diagnosis not present

## 2012-03-27 DIAGNOSIS — R071 Chest pain on breathing: Secondary | ICD-10-CM | POA: Diagnosis not present

## 2012-03-27 DIAGNOSIS — J449 Chronic obstructive pulmonary disease, unspecified: Secondary | ICD-10-CM | POA: Diagnosis not present

## 2012-04-04 DIAGNOSIS — M4310 Spondylolisthesis, site unspecified: Secondary | ICD-10-CM | POA: Diagnosis not present

## 2012-04-13 ENCOUNTER — Ambulatory Visit: Payer: Medicare Other | Admitting: Physical Therapy

## 2012-04-19 ENCOUNTER — Ambulatory Visit: Payer: Medicare Other | Attending: Neurosurgery | Admitting: Physical Therapy

## 2012-04-19 DIAGNOSIS — IMO0001 Reserved for inherently not codable concepts without codable children: Secondary | ICD-10-CM | POA: Diagnosis not present

## 2012-04-19 DIAGNOSIS — M25559 Pain in unspecified hip: Secondary | ICD-10-CM | POA: Diagnosis not present

## 2012-04-19 DIAGNOSIS — M545 Low back pain, unspecified: Secondary | ICD-10-CM | POA: Diagnosis not present

## 2012-04-21 ENCOUNTER — Ambulatory Visit: Payer: Medicare Other | Admitting: Physical Therapy

## 2012-04-24 ENCOUNTER — Ambulatory Visit: Payer: Medicare Other | Admitting: Physical Therapy

## 2012-04-27 ENCOUNTER — Ambulatory Visit: Payer: Medicare Other | Admitting: Physical Therapy

## 2012-04-28 DIAGNOSIS — S8990XA Unspecified injury of unspecified lower leg, initial encounter: Secondary | ICD-10-CM | POA: Diagnosis not present

## 2012-04-28 DIAGNOSIS — IMO0002 Reserved for concepts with insufficient information to code with codable children: Secondary | ICD-10-CM | POA: Diagnosis not present

## 2012-04-28 DIAGNOSIS — M47817 Spondylosis without myelopathy or radiculopathy, lumbosacral region: Secondary | ICD-10-CM | POA: Diagnosis not present

## 2012-04-28 DIAGNOSIS — M7989 Other specified soft tissue disorders: Secondary | ICD-10-CM | POA: Diagnosis not present

## 2012-04-28 DIAGNOSIS — S79919A Unspecified injury of unspecified hip, initial encounter: Secondary | ICD-10-CM | POA: Diagnosis not present

## 2012-05-05 ENCOUNTER — Ambulatory Visit: Payer: Medicare Other | Attending: Neurosurgery | Admitting: Physical Therapy

## 2012-05-05 DIAGNOSIS — M25559 Pain in unspecified hip: Secondary | ICD-10-CM | POA: Insufficient documentation

## 2012-05-05 DIAGNOSIS — M545 Low back pain, unspecified: Secondary | ICD-10-CM | POA: Insufficient documentation

## 2012-05-05 DIAGNOSIS — IMO0001 Reserved for inherently not codable concepts without codable children: Secondary | ICD-10-CM | POA: Insufficient documentation

## 2012-05-09 ENCOUNTER — Ambulatory Visit: Payer: Medicare Other | Admitting: Physical Therapy

## 2012-05-09 DIAGNOSIS — M25559 Pain in unspecified hip: Secondary | ICD-10-CM | POA: Diagnosis not present

## 2012-05-09 DIAGNOSIS — M545 Low back pain, unspecified: Secondary | ICD-10-CM | POA: Diagnosis not present

## 2012-05-09 DIAGNOSIS — IMO0001 Reserved for inherently not codable concepts without codable children: Secondary | ICD-10-CM | POA: Diagnosis not present

## 2012-05-11 ENCOUNTER — Ambulatory Visit: Payer: Medicare Other | Admitting: Physical Therapy

## 2012-05-19 DIAGNOSIS — T148XXA Other injury of unspecified body region, initial encounter: Secondary | ICD-10-CM | POA: Diagnosis not present

## 2012-05-25 DIAGNOSIS — L089 Local infection of the skin and subcutaneous tissue, unspecified: Secondary | ICD-10-CM | POA: Diagnosis not present

## 2012-05-25 DIAGNOSIS — T148XXA Other injury of unspecified body region, initial encounter: Secondary | ICD-10-CM | POA: Diagnosis not present

## 2012-07-04 DIAGNOSIS — M549 Dorsalgia, unspecified: Secondary | ICD-10-CM | POA: Diagnosis not present

## 2012-08-09 DIAGNOSIS — H268 Other specified cataract: Secondary | ICD-10-CM | POA: Diagnosis not present

## 2012-08-09 DIAGNOSIS — H2510 Age-related nuclear cataract, unspecified eye: Secondary | ICD-10-CM | POA: Diagnosis not present

## 2012-08-09 DIAGNOSIS — H04129 Dry eye syndrome of unspecified lacrimal gland: Secondary | ICD-10-CM | POA: Diagnosis not present

## 2012-08-17 DIAGNOSIS — E785 Hyperlipidemia, unspecified: Secondary | ICD-10-CM | POA: Diagnosis not present

## 2012-08-17 DIAGNOSIS — M810 Age-related osteoporosis without current pathological fracture: Secondary | ICD-10-CM | POA: Diagnosis not present

## 2012-08-18 DIAGNOSIS — D539 Nutritional anemia, unspecified: Secondary | ICD-10-CM | POA: Diagnosis not present

## 2012-08-21 DIAGNOSIS — Z8679 Personal history of other diseases of the circulatory system: Secondary | ICD-10-CM | POA: Diagnosis not present

## 2012-08-21 DIAGNOSIS — I6529 Occlusion and stenosis of unspecified carotid artery: Secondary | ICD-10-CM | POA: Diagnosis not present

## 2012-08-21 DIAGNOSIS — Z1331 Encounter for screening for depression: Secondary | ICD-10-CM | POA: Diagnosis not present

## 2012-08-21 DIAGNOSIS — Z0000 Encounter for general adult medical examination without abnormal findings: Secondary | ICD-10-CM | POA: Diagnosis not present

## 2012-08-21 DIAGNOSIS — M48061 Spinal stenosis, lumbar region without neurogenic claudication: Secondary | ICD-10-CM | POA: Diagnosis not present

## 2012-08-21 DIAGNOSIS — M810 Age-related osteoporosis without current pathological fracture: Secondary | ICD-10-CM | POA: Diagnosis not present

## 2012-08-21 DIAGNOSIS — Z124 Encounter for screening for malignant neoplasm of cervix: Secondary | ICD-10-CM | POA: Diagnosis not present

## 2012-08-21 DIAGNOSIS — D539 Nutritional anemia, unspecified: Secondary | ICD-10-CM | POA: Diagnosis not present

## 2012-08-21 DIAGNOSIS — E785 Hyperlipidemia, unspecified: Secondary | ICD-10-CM | POA: Diagnosis not present

## 2012-08-21 DIAGNOSIS — D126 Benign neoplasm of colon, unspecified: Secondary | ICD-10-CM | POA: Diagnosis not present

## 2012-08-23 DIAGNOSIS — Z1212 Encounter for screening for malignant neoplasm of rectum: Secondary | ICD-10-CM | POA: Diagnosis not present

## 2012-08-28 ENCOUNTER — Encounter: Payer: Self-pay | Admitting: Internal Medicine

## 2012-08-28 ENCOUNTER — Other Ambulatory Visit (INDEPENDENT_AMBULATORY_CARE_PROVIDER_SITE_OTHER): Payer: Medicare Other | Admitting: *Deleted

## 2012-08-28 DIAGNOSIS — I6529 Occlusion and stenosis of unspecified carotid artery: Secondary | ICD-10-CM

## 2012-08-31 ENCOUNTER — Other Ambulatory Visit (HOSPITAL_COMMUNITY): Payer: Self-pay | Admitting: Internal Medicine

## 2012-08-31 DIAGNOSIS — Z1231 Encounter for screening mammogram for malignant neoplasm of breast: Secondary | ICD-10-CM

## 2012-10-03 DIAGNOSIS — M48061 Spinal stenosis, lumbar region without neurogenic claudication: Secondary | ICD-10-CM | POA: Diagnosis not present

## 2012-10-03 DIAGNOSIS — R636 Underweight: Secondary | ICD-10-CM | POA: Diagnosis not present

## 2013-01-06 DIAGNOSIS — Z23 Encounter for immunization: Secondary | ICD-10-CM | POA: Diagnosis not present

## 2013-01-30 DIAGNOSIS — N898 Other specified noninflammatory disorders of vagina: Secondary | ICD-10-CM | POA: Diagnosis not present

## 2013-01-30 DIAGNOSIS — Z681 Body mass index (BMI) 19 or less, adult: Secondary | ICD-10-CM | POA: Diagnosis not present

## 2013-01-30 DIAGNOSIS — F329 Major depressive disorder, single episode, unspecified: Secondary | ICD-10-CM | POA: Diagnosis not present

## 2013-02-07 DIAGNOSIS — H04129 Dry eye syndrome of unspecified lacrimal gland: Secondary | ICD-10-CM | POA: Diagnosis not present

## 2013-02-07 DIAGNOSIS — H268 Other specified cataract: Secondary | ICD-10-CM | POA: Diagnosis not present

## 2013-02-07 DIAGNOSIS — H2510 Age-related nuclear cataract, unspecified eye: Secondary | ICD-10-CM | POA: Diagnosis not present

## 2013-02-13 DIAGNOSIS — M9981 Other biomechanical lesions of cervical region: Secondary | ICD-10-CM | POA: Diagnosis not present

## 2013-02-13 DIAGNOSIS — M546 Pain in thoracic spine: Secondary | ICD-10-CM | POA: Diagnosis not present

## 2013-02-13 DIAGNOSIS — M542 Cervicalgia: Secondary | ICD-10-CM | POA: Diagnosis not present

## 2013-02-13 DIAGNOSIS — M999 Biomechanical lesion, unspecified: Secondary | ICD-10-CM | POA: Diagnosis not present

## 2013-02-16 DIAGNOSIS — M546 Pain in thoracic spine: Secondary | ICD-10-CM | POA: Diagnosis not present

## 2013-02-16 DIAGNOSIS — M542 Cervicalgia: Secondary | ICD-10-CM | POA: Diagnosis not present

## 2013-02-16 DIAGNOSIS — M9981 Other biomechanical lesions of cervical region: Secondary | ICD-10-CM | POA: Diagnosis not present

## 2013-02-16 DIAGNOSIS — M999 Biomechanical lesion, unspecified: Secondary | ICD-10-CM | POA: Diagnosis not present

## 2013-02-19 ENCOUNTER — Encounter (HOSPITAL_COMMUNITY): Payer: Self-pay | Admitting: Emergency Medicine

## 2013-02-19 ENCOUNTER — Emergency Department (INDEPENDENT_AMBULATORY_CARE_PROVIDER_SITE_OTHER)
Admission: EM | Admit: 2013-02-19 | Discharge: 2013-02-19 | Disposition: A | Discharge status: Home / Self Care | Payer: Medicare Other | Source: Home / Self Care | Attending: Emergency Medicine | Admitting: Emergency Medicine

## 2013-02-19 DIAGNOSIS — M5430 Sciatica, unspecified side: Secondary | ICD-10-CM

## 2013-02-19 DIAGNOSIS — M5431 Sciatica, right side: Secondary | ICD-10-CM

## 2013-02-19 MED ORDER — HYDROCODONE-ACETAMINOPHEN 5-325 MG PO TABS: ORAL_TABLET | ORAL | Status: DC

## 2013-02-19 MED ORDER — METHYLPREDNISOLONE ACETATE 80 MG/ML IJ SUSP
INTRAMUSCULAR | Status: AC
  Filled 2013-02-19: qty 1

## 2013-02-19 MED ORDER — METHOCARBAMOL 500 MG PO TABS: 500.0000 mg | ORAL_TABLET | Freq: Three times a day (TID) | ORAL | Status: DC

## 2013-02-19 MED ORDER — METHYLPREDNISOLONE ACETATE 80 MG/ML IJ SUSP
80.0000 mg | Freq: Once | INTRAMUSCULAR | Status: AC
  Administered 2013-02-19: 80 mg via INTRAMUSCULAR

## 2013-02-19 NOTE — ED Provider Notes (Signed)
Chief Complaint:   Chief Complaint  Patient presents with  . Back Pain    History of Present Illness:   Donna Cummings is a very active 79 year old female with a history of spinal stenosis who has had a one-week history of pain in her lower back. This began after going to an exercise class and rocking back and forth a piece of foam which was one of the exercises that they did there. The back has been worse since then, and over the past couple days has gotten markedly worse with radiation down the right leg as far as the ankle with some numbness, tingling, and a sensation of weakness in the leg. She denies any bladder or bowel dysfunction. She has difficulty ambulating.  Review of Systems:  Other than noted above, the patient denies any of the following symptoms: Systemic:  No fever, chills, severe fatigue, or unexplained weight loss. GI:  No abdominal pain, nausea, vomiting, diarrhea, constipation, incontinence of bowel, or blood in stool. GU:  No dysuria, frequency, urgency, or hematuria. No incontinence of urine or difficulty urinating.  M-S:  No neck pain, joint pain, arthritis, or myalgias. Neuro:  No paresthesias, saddle anesthesia, muscular weakness, or progressive neurological deficit.  PMFSH:  Past medical history, family history, social history, meds, and allergies were reviewed. Specifically, there is no history of cancer, major trauma, osteoporosis, immunosuppression, or HIV infection.  Physical Exam:   Vital signs:  BP 140/66  Pulse 62  Temp(Src) 98 F (36.7 C) (Oral)  Resp 16  SpO2 100% General:  Alert, oriented, in no distress. Abdomen:  Soft, non-tender.  No organomegaly or mass.  No pulsatile midline abdominal mass or bruit. Back:  She is sitting in a wheelchair and has difficulty getting up. The lower back is tender to palpation. Range of motion is markedly diminished. Straight leg raising is negative bilaterally. Neuro:  Normal muscle strength, sensations and  DTRs. Extremities: Pedal pulses were full, there was no edema. Skin:  Clear, warm and dry.  No rash.  Course in Urgent Care Center:   Given Depo-Medrol 80 mg IM.  Assessment:  The encounter diagnosis was Sciatica, right.  No evidence of cauda equina syndrome.  Plan:   1.  Meds:  The following meds were prescribed:   New Prescriptions   HYDROCODONE-ACETAMINOPHEN (NORCO/VICODIN) 5-325 MG PER TABLET    1 to 2 tabs every 4 to 6 hours as needed for pain.   METHOCARBAMOL (ROBAXIN) 500 MG TABLET    Take 1 tablet (500 mg total) by mouth 3 (three) times daily.    2.  Patient Education/Counseling:  The patient was given appropriate handouts, self care instructions, and instructed in symptomatic relief. The patient was encouraged to try to be as active as possible and given some exercises to do followed by moist heat.  3.  Follow up:  The patient was told to follow up if no better in 3 to 4 days, if becoming worse in any way, and given some red flag symptoms such as increasing pain or new neurological symptoms which would prompt immediate return.  Follow up with her primary care physician in one week.     Reuben Likes, MD 02/19/13 [phone]

## 2013-02-19 NOTE — ED Notes (Signed)
Pt  Reports l;ast  Week  She  Injured her  Lower  Back  While     Working out on a  Foam mat   She  denys   Falling  She  Thinks   She  May  Have  Twisted  Wrong        -   She   Reports       Pain on  Movement  And  posistion

## 2013-02-20 NOTE — ED Notes (Signed)
Returned call from answering machine, patient wants another cortisone shot. Called patient, and discussed how a second shot this soon is not advisable, and she should go ahead and take the medication as prescribed , since the MD who saw her wanted her to be more comfortable. Patient voiced concerns about taking strong medications, as she does not take them, and is concerned about constipation. Is planning to drive to [facility] in AM, and was advised that if she does not feel 100% in Am, she should consider NOT driving and see if she can delay her trip or have someone drive for her instead. Patient voiced understanding of plan

## 2013-02-28 DIAGNOSIS — M48061 Spinal stenosis, lumbar region without neurogenic claudication: Secondary | ICD-10-CM | POA: Diagnosis not present

## 2013-03-08 DIAGNOSIS — R7301 Impaired fasting glucose: Secondary | ICD-10-CM | POA: Diagnosis not present

## 2013-03-08 DIAGNOSIS — F329 Major depressive disorder, single episode, unspecified: Secondary | ICD-10-CM | POA: Diagnosis not present

## 2013-03-08 DIAGNOSIS — M48061 Spinal stenosis, lumbar region without neurogenic claudication: Secondary | ICD-10-CM | POA: Diagnosis not present

## 2013-03-08 DIAGNOSIS — Z8679 Personal history of other diseases of the circulatory system: Secondary | ICD-10-CM | POA: Diagnosis not present

## 2013-03-08 DIAGNOSIS — M79609 Pain in unspecified limb: Secondary | ICD-10-CM | POA: Diagnosis not present

## 2013-03-08 DIAGNOSIS — F3289 Other specified depressive episodes: Secondary | ICD-10-CM | POA: Diagnosis not present

## 2013-03-14 DIAGNOSIS — M48061 Spinal stenosis, lumbar region without neurogenic claudication: Secondary | ICD-10-CM | POA: Diagnosis not present

## 2013-03-19 DIAGNOSIS — IMO0002 Reserved for concepts with insufficient information to code with codable children: Secondary | ICD-10-CM | POA: Diagnosis not present

## 2013-03-19 DIAGNOSIS — M999 Biomechanical lesion, unspecified: Secondary | ICD-10-CM | POA: Diagnosis not present

## 2013-03-19 DIAGNOSIS — Q762 Congenital spondylolisthesis: Secondary | ICD-10-CM | POA: Diagnosis not present

## 2013-03-19 DIAGNOSIS — M5137 Other intervertebral disc degeneration, lumbosacral region: Secondary | ICD-10-CM | POA: Diagnosis not present

## 2013-03-19 DIAGNOSIS — M25559 Pain in unspecified hip: Secondary | ICD-10-CM | POA: Diagnosis not present

## 2013-03-22 DIAGNOSIS — IMO0002 Reserved for concepts with insufficient information to code with codable children: Secondary | ICD-10-CM | POA: Diagnosis not present

## 2013-03-22 DIAGNOSIS — M48061 Spinal stenosis, lumbar region without neurogenic claudication: Secondary | ICD-10-CM | POA: Diagnosis not present

## 2013-03-22 DIAGNOSIS — M549 Dorsalgia, unspecified: Secondary | ICD-10-CM | POA: Diagnosis not present

## 2013-03-28 DIAGNOSIS — M549 Dorsalgia, unspecified: Secondary | ICD-10-CM | POA: Diagnosis not present

## 2013-03-28 DIAGNOSIS — IMO0002 Reserved for concepts with insufficient information to code with codable children: Secondary | ICD-10-CM | POA: Diagnosis not present

## 2013-03-28 DIAGNOSIS — M48061 Spinal stenosis, lumbar region without neurogenic claudication: Secondary | ICD-10-CM | POA: Diagnosis not present

## 2013-04-02 ENCOUNTER — Ambulatory Visit (HOSPITAL_COMMUNITY)
Admission: RE | Admit: 2013-04-02 | Discharge: 2013-04-02 | Disposition: A | Discharge status: Home / Self Care | Payer: Medicare Other | Source: Ambulatory Visit | Attending: Surgery | Admitting: Surgery

## 2013-04-02 ENCOUNTER — Other Ambulatory Visit (HOSPITAL_COMMUNITY): Payer: Self-pay | Admitting: Internal Medicine

## 2013-04-02 DIAGNOSIS — I739 Peripheral vascular disease, unspecified: Secondary | ICD-10-CM | POA: Insufficient documentation

## 2013-04-02 DIAGNOSIS — Z87891 Personal history of nicotine dependence: Secondary | ICD-10-CM | POA: Diagnosis not present

## 2013-04-02 DIAGNOSIS — M79609 Pain in unspecified limb: Secondary | ICD-10-CM | POA: Diagnosis not present

## 2013-04-11 DIAGNOSIS — M48061 Spinal stenosis, lumbar region without neurogenic claudication: Secondary | ICD-10-CM | POA: Diagnosis not present

## 2013-04-11 DIAGNOSIS — IMO0002 Reserved for concepts with insufficient information to code with codable children: Secondary | ICD-10-CM | POA: Diagnosis not present

## 2013-04-12 DIAGNOSIS — Q762 Congenital spondylolisthesis: Secondary | ICD-10-CM | POA: Diagnosis not present

## 2013-04-12 DIAGNOSIS — M48061 Spinal stenosis, lumbar region without neurogenic claudication: Secondary | ICD-10-CM | POA: Diagnosis not present

## 2013-04-12 DIAGNOSIS — IMO0002 Reserved for concepts with insufficient information to code with codable children: Secondary | ICD-10-CM | POA: Diagnosis not present

## 2013-04-12 DIAGNOSIS — R636 Underweight: Secondary | ICD-10-CM | POA: Diagnosis not present

## 2013-04-19 ENCOUNTER — Other Ambulatory Visit: Payer: Self-pay | Admitting: Neurosurgery

## 2013-05-02 ENCOUNTER — Encounter (HOSPITAL_COMMUNITY): Payer: Self-pay | Admitting: Pharmacy Technician

## 2013-05-03 ENCOUNTER — Encounter (HOSPITAL_COMMUNITY): Payer: Self-pay

## 2013-05-03 NOTE — Pre-Procedure Instructions (Signed)
Donna Cummings  05/03/2013   Your procedure is scheduled on:  [place], March 16.  Report to [facility], Main Entrance [facility] "A"at 5:30  Call this number if you have problems the morning of surgery: [phone]   Remember:   Do not eat food or drink liquids after midnight, Sunday, March 15.   Take these medicines the morning of surgery with A SIP OF WATER: escitalopram (LEXAPRO).   Stop taking Aspirin, Coumadin, Plavix, Effient and Herbal medications.  Donnot take any NSAIDs ie: Ibuprofen,  Advil,Naproxen or any medication containing Aspirin.    Do not wear jewelry, make-up or nail polish.  Do not wear lotions, powders, or perfumes.   Do not shave 48 hours prior to surgery.   Do not bring valuables to the hospital.  [facility] is not responsible  for any belongings or valuables.               Contacts, dentures or bridgework may not be worn into surgery.  Leave suitcase in the car. After surgery it may be brought to your room.  For patients admitted to the hospital, discharge time is determined by your treatment team.             Special Instructions: Review  [facility] - Preparing For Surgery.   Please read over the following fact sheets that you were given: Pain Booklet, Coughing and Deep Breathing, Blood Transfusion Information and Surgical Site Infection Prevention

## 2013-05-04 ENCOUNTER — Encounter (HOSPITAL_COMMUNITY)
Admission: RE | Admit: 2013-05-04 | Discharge: 2013-05-04 | Disposition: A | Discharge status: Home / Self Care | Payer: Medicare Other | Source: Ambulatory Visit | Attending: Neurosurgery | Admitting: Neurosurgery

## 2013-05-04 ENCOUNTER — Encounter (HOSPITAL_COMMUNITY): Payer: Self-pay

## 2013-05-04 DIAGNOSIS — Z01812 Encounter for preprocedural laboratory examination: Secondary | ICD-10-CM | POA: Insufficient documentation

## 2013-05-04 HISTORY — DX: Cerebral infarction, unspecified: I63.9

## 2013-05-04 LAB — BASIC METABOLIC PANEL
BUN: 17 mg/dL (ref 6–23)
CHLORIDE: 99 meq/L (ref 96–112)
CO2: 26 mEq/L (ref 19–32)
Calcium: 9.5 mg/dL (ref 8.4–10.5)
Creatinine, Ser: 0.36 mg/dL — ABNORMAL LOW (ref 0.50–1.10)
GFR calc non Af Amer: >90 mL/min (ref >90)
Glucose, Bld: 82 mg/dL (ref 70–99)
POTASSIUM: 5.3 meq/L (ref 3.7–5.3)
Sodium: 135 mEq/L — ABNORMAL LOW (ref 137–147)

## 2013-05-04 LAB — CBC
HEMATOCRIT: 35.9 % — AB (ref 36.0–46.0)
HEMOGLOBIN: 12.9 g/dL (ref 12.0–15.0)
MCH: 36 pg — ABNORMAL HIGH (ref 26.0–34.0)
MCHC: 35.9 g/dL (ref 30.0–36.0)
MCV: 100.3 fL — ABNORMAL HIGH (ref 78.0–100.0)
Platelets: 257 10*3/uL (ref 150–400)
RBC: 3.58 MIL/uL — ABNORMAL LOW (ref 3.87–5.11)
RDW: 12.8 % (ref 11.5–15.5)
WBC: 5.3 10*3/uL (ref 4.0–10.5)

## 2013-05-04 LAB — ABO/RH: ABO/RH(D): B NEG

## 2013-05-04 LAB — TYPE AND SCREEN
ABO/RH(D): B NEG
ANTIBODY SCREEN: NEGATIVE

## 2013-05-04 LAB — SURGICAL PCR SCREEN
MRSA, PCR: NEGATIVE
Staphylococcus aureus: NEGATIVE

## 2013-05-04 NOTE — Pre-Procedure Instructions (Signed)
Donna Cummings  05/04/2013   Your procedure is scheduled on:  05/14/13  Report to [facility]  2 * 3 at 530 AM.  Call this number if you have problems the morning of surgery: [phone]   Remember:   Do not eat food or drink liquids after midnight.   Take these medicines the morning of surgery with A SIP OF WATER: lexapro   Do not wear jewelry, make-up or nail polish.  Do not wear lotions, powders, or perfumes. You may wear deodorant.  Do not shave 48 hours prior to surgery. Men may shave face and neck.  Do not bring valuables to the hospital.  [facility] is not responsible                  for any belongings or valuables.               Contacts, dentures or bridgework may not be worn into surgery.  Leave suitcase in the car. After surgery it may be brought to your room.  For patients admitted to the hospital, discharge time is determined by your                treatment team.               Patients discharged the day of surgery will not be allowed to drive  home.  Name and phone number of your driver: family  Special Instructions: Shower using CHG 2 nights before surgery and the night before surgery.  If you shower the day of surgery use CHG.  Use special wash - you have one bottle of CHG for all showers.  You should use approximately 1/3 of the bottle for each shower.   Please read over the following fact sheets that you were given: Pain Booklet, MRSA Information and Surgical Site Infection Prevention

## 2013-05-13 MED ORDER — VANCOMYCIN HCL IN DEXTROSE 1-5 GM/200ML-% IV SOLN
1000.0000 mg | INTRAVENOUS | Status: AC
  Administered 2013-05-14: 1000 mg via INTRAVENOUS
  Filled 2013-05-13: qty 200

## 2013-05-14 ENCOUNTER — Inpatient Hospital Stay (HOSPITAL_COMMUNITY): Payer: Medicare Other

## 2013-05-14 ENCOUNTER — Encounter (HOSPITAL_COMMUNITY): Payer: Medicare Other | Admitting: Certified Registered Nurse Anesthetist

## 2013-05-14 ENCOUNTER — Inpatient Hospital Stay (HOSPITAL_COMMUNITY)
Admission: RE | Admit: 2013-05-14 | Discharge: 2013-05-21 | DRG: 460 | Disposition: A | Discharge status: Skilled Nursing Facility | Payer: Medicare Other | Source: Ambulatory Visit | Attending: Neurosurgery | Admitting: Neurosurgery

## 2013-05-14 ENCOUNTER — Encounter (HOSPITAL_COMMUNITY): Payer: Self-pay | Admitting: Certified Registered Nurse Anesthetist

## 2013-05-14 ENCOUNTER — Encounter (HOSPITAL_COMMUNITY)
Admission: RE | Disposition: A | Discharge status: Skilled Nursing Facility | Payer: Medicare Other | Source: Ambulatory Visit | Attending: Neurosurgery

## 2013-05-14 ENCOUNTER — Inpatient Hospital Stay (HOSPITAL_COMMUNITY): Payer: Medicare Other | Admitting: Certified Registered Nurse Anesthetist

## 2013-05-14 DIAGNOSIS — M5137 Other intervertebral disc degeneration, lumbosacral region: Secondary | ICD-10-CM | POA: Diagnosis not present

## 2013-05-14 DIAGNOSIS — Z85828 Personal history of other malignant neoplasm of skin: Secondary | ICD-10-CM

## 2013-05-14 DIAGNOSIS — M51379 Other intervertebral disc degeneration, lumbosacral region without mention of lumbar back pain or lower extremity pain: Secondary | ICD-10-CM | POA: Diagnosis present

## 2013-05-14 DIAGNOSIS — E871 Hypo-osmolality and hyponatremia: Secondary | ICD-10-CM | POA: Diagnosis not present

## 2013-05-14 DIAGNOSIS — M545 Low back pain, unspecified: Secondary | ICD-10-CM | POA: Diagnosis not present

## 2013-05-14 DIAGNOSIS — Z96649 Presence of unspecified artificial hip joint: Secondary | ICD-10-CM | POA: Diagnosis not present

## 2013-05-14 DIAGNOSIS — Z4789 Encounter for other orthopedic aftercare: Secondary | ICD-10-CM | POA: Diagnosis not present

## 2013-05-14 DIAGNOSIS — M4316 Spondylolisthesis, lumbar region: Secondary | ICD-10-CM | POA: Diagnosis present

## 2013-05-14 DIAGNOSIS — Z8673 Personal history of transient ischemic attack (TIA), and cerebral infarction without residual deficits: Secondary | ICD-10-CM | POA: Diagnosis not present

## 2013-05-14 DIAGNOSIS — Z87891 Personal history of nicotine dependence: Secondary | ICD-10-CM | POA: Diagnosis not present

## 2013-05-14 DIAGNOSIS — M6281 Muscle weakness (generalized): Secondary | ICD-10-CM | POA: Diagnosis not present

## 2013-05-14 DIAGNOSIS — M549 Dorsalgia, unspecified: Secondary | ICD-10-CM | POA: Diagnosis not present

## 2013-05-14 DIAGNOSIS — Z79899 Other long term (current) drug therapy: Secondary | ICD-10-CM

## 2013-05-14 DIAGNOSIS — M48061 Spinal stenosis, lumbar region without neurogenic claudication: Secondary | ICD-10-CM | POA: Diagnosis not present

## 2013-05-14 DIAGNOSIS — IMO0002 Reserved for concepts with insufficient information to code with codable children: Secondary | ICD-10-CM | POA: Diagnosis not present

## 2013-05-14 DIAGNOSIS — M4310 Spondylolisthesis, site unspecified: Secondary | ICD-10-CM | POA: Diagnosis not present

## 2013-05-14 DIAGNOSIS — M48062 Spinal stenosis, lumbar region with neurogenic claudication: Secondary | ICD-10-CM | POA: Diagnosis not present

## 2013-05-14 DIAGNOSIS — Z7982 Long term (current) use of aspirin: Secondary | ICD-10-CM

## 2013-05-14 DIAGNOSIS — R279 Unspecified lack of coordination: Secondary | ICD-10-CM | POA: Diagnosis not present

## 2013-05-14 DIAGNOSIS — F3289 Other specified depressive episodes: Secondary | ICD-10-CM | POA: Diagnosis not present

## 2013-05-14 DIAGNOSIS — F411 Generalized anxiety disorder: Secondary | ICD-10-CM | POA: Diagnosis not present

## 2013-05-14 DIAGNOSIS — R262 Difficulty in walking, not elsewhere classified: Secondary | ICD-10-CM | POA: Diagnosis not present

## 2013-05-14 DIAGNOSIS — I959 Hypotension, unspecified: Secondary | ICD-10-CM | POA: Diagnosis not present

## 2013-05-14 DIAGNOSIS — Q762 Congenital spondylolisthesis: Principal | ICD-10-CM

## 2013-05-14 DIAGNOSIS — F329 Major depressive disorder, single episode, unspecified: Secondary | ICD-10-CM | POA: Diagnosis not present

## 2013-05-14 DIAGNOSIS — G4700 Insomnia, unspecified: Secondary | ICD-10-CM | POA: Diagnosis not present

## 2013-05-14 HISTORY — PX: BACK SURGERY: SHX140

## 2013-05-14 SURGERY — POSTERIOR LUMBAR FUSION 2 LEVEL
Anesthesia: General | Site: Back

## 2013-05-14 MED ORDER — MEPERIDINE HCL 25 MG/ML IJ SOLN
6.2500 mg | INTRAMUSCULAR | Status: DC | PRN
Start: 2013-05-14 — End: 2013-05-14

## 2013-05-14 MED ORDER — ROCURONIUM BROMIDE 50 MG/5ML IV SOLN
INTRAVENOUS | Status: AC
  Filled 2013-05-14: qty 1

## 2013-05-14 MED ORDER — ARTIFICIAL TEARS OP OINT
TOPICAL_OINTMENT | OPHTHALMIC | Status: DC | PRN
  Administered 2013-05-14: 1 via OPHTHALMIC

## 2013-05-14 MED ORDER — BUPIVACAINE-EPINEPHRINE PF 0.5-1:200000 % IJ SOLN
INTRAMUSCULAR | Status: DC | PRN
Start: 2013-05-14 — End: 2013-05-14
  Administered 2013-05-14: 10 mL

## 2013-05-14 MED ORDER — FENTANYL CITRATE 0.05 MG/ML IJ SOLN
INTRAMUSCULAR | Status: AC
  Filled 2013-05-14: qty 5

## 2013-05-14 MED ORDER — HYDROMORPHONE HCL PF 1 MG/ML IJ SOLN
0.2500 mg | INTRAMUSCULAR | Status: DC | PRN
  Administered 2013-05-14 (×2): 0.25 mg via INTRAVENOUS

## 2013-05-14 MED ORDER — METHYLCELLULOSE 1 % OP SOLN: 1.0000 [drp] | Freq: Every day | OPHTHALMIC | Status: DC

## 2013-05-14 MED ORDER — STERILE WATER FOR INJECTION IJ SOLN
INTRAMUSCULAR | Status: AC
  Filled 2013-05-14: qty 10

## 2013-05-14 MED ORDER — GLYCOPYRROLATE 0.2 MG/ML IJ SOLN
INTRAMUSCULAR | Status: DC | PRN
  Administered 2013-05-14: .8 mg via INTRAVENOUS

## 2013-05-14 MED ORDER — PROMETHAZINE HCL 25 MG/ML IJ SOLN: 6.2500 mg | INTRAMUSCULAR | Status: DC | PRN

## 2013-05-14 MED ORDER — ONDANSETRON HCL 4 MG/2ML IJ SOLN
INTRAMUSCULAR | Status: DC | PRN
  Administered 2013-05-14: 4 mg via INTRAVENOUS

## 2013-05-14 MED ORDER — NEOSTIGMINE METHYLSULFATE 1 MG/ML IJ SOLN
INTRAMUSCULAR | Status: DC | PRN
  Administered 2013-05-14: 4 mg via INTRAVENOUS

## 2013-05-14 MED ORDER — LIDOCAINE HCL (CARDIAC) 20 MG/ML IV SOLN
INTRAVENOUS | Status: AC
  Filled 2013-05-14: qty 5

## 2013-05-14 MED ORDER — FENTANYL CITRATE 0.05 MG/ML IJ SOLN
INTRAMUSCULAR | Status: DC | PRN
  Administered 2013-05-14: 50 ug via INTRAVENOUS
  Administered 2013-05-14: 200 ug via INTRAVENOUS
  Administered 2013-05-14 (×2): 50 ug via INTRAVENOUS

## 2013-05-14 MED ORDER — GLYCOPYRROLATE 0.2 MG/ML IJ SOLN
INTRAMUSCULAR | Status: AC
  Filled 2013-05-14: qty 4

## 2013-05-14 MED ORDER — ACETAMINOPHEN 325 MG PO TABS
650.0000 mg | ORAL_TABLET | ORAL | Status: DC | PRN
Start: 2013-05-14 — End: 2013-05-21
  Administered 2013-05-14 – 2013-05-19 (×2): 650 mg via ORAL
  Filled 2013-05-14 (×2): qty 2

## 2013-05-14 MED ORDER — MIDAZOLAM HCL 5 MG/5ML IJ SOLN
INTRAMUSCULAR | Status: DC | PRN
  Administered 2013-05-14: 1 mg via INTRAVENOUS

## 2013-05-14 MED ORDER — OXYCODONE HCL 5 MG PO TABS
ORAL_TABLET | ORAL | Status: AC
  Filled 2013-05-14: qty 1

## 2013-05-14 MED ORDER — HYDROCODONE-ACETAMINOPHEN 5-325 MG PO TABS
1.0000 | ORAL_TABLET | ORAL | Status: DC | PRN
  Administered 2013-05-19: 2 via ORAL
  Administered 2013-05-20: 1 via ORAL
  Administered 2013-05-20: 2 via ORAL
  Filled 2013-05-14 (×2): qty 2
  Filled 2013-05-14: qty 1

## 2013-05-14 MED ORDER — MENTHOL 3 MG MT LOZG: 1.0000 | LOZENGE | OROMUCOSAL | Status: DC | PRN

## 2013-05-14 MED ORDER — PROPOFOL 10 MG/ML IV BOLUS
INTRAVENOUS | Status: AC
  Filled 2013-05-14: qty 20

## 2013-05-14 MED ORDER — SODIUM CHLORIDE 0.9 % IR SOLN
Status: DC | PRN
  Administered 2013-05-14: 09:00:00

## 2013-05-14 MED ORDER — NEOSTIGMINE METHYLSULFATE 1 MG/ML IJ SOLN
INTRAMUSCULAR | Status: AC
  Filled 2013-05-14: qty 10

## 2013-05-14 MED ORDER — ROCURONIUM BROMIDE 100 MG/10ML IV SOLN
INTRAVENOUS | Status: DC | PRN
  Administered 2013-05-14: 20 mg via INTRAVENOUS
  Administered 2013-05-14: 10 mg via INTRAVENOUS
  Administered 2013-05-14: 40 mg via INTRAVENOUS

## 2013-05-14 MED ORDER — ONDANSETRON HCL 4 MG/2ML IJ SOLN
INTRAMUSCULAR | Status: AC
  Filled 2013-05-14: qty 2

## 2013-05-14 MED ORDER — PHENYLEPHRINE 40 MCG/ML (10ML) SYRINGE FOR IV PUSH (FOR BLOOD PRESSURE SUPPORT)
PREFILLED_SYRINGE | INTRAVENOUS | Status: AC
  Filled 2013-05-14: qty 10

## 2013-05-14 MED ORDER — PROPOFOL 10 MG/ML IV BOLUS
INTRAVENOUS | Status: DC | PRN
  Administered 2013-05-14: 90 mg via INTRAVENOUS

## 2013-05-14 MED ORDER — PHENOL 1.4 % MT LIQD: 1.0000 | OROMUCOSAL | Status: DC | PRN

## 2013-05-14 MED ORDER — MIDAZOLAM HCL 2 MG/2ML IJ SOLN
INTRAMUSCULAR | Status: AC
  Filled 2013-05-14: qty 2

## 2013-05-14 MED ORDER — HYDROMORPHONE HCL PF 1 MG/ML IJ SOLN
INTRAMUSCULAR | Status: AC
  Filled 2013-05-14: qty 1

## 2013-05-14 MED ORDER — LACTATED RINGERS IV SOLN
INTRAVENOUS | Status: DC | PRN
  Administered 2013-05-14 (×3): via INTRAVENOUS

## 2013-05-14 MED ORDER — PHENYLEPHRINE HCL 10 MG/ML IJ SOLN
INTRAMUSCULAR | Status: DC | PRN
  Administered 2013-05-14: 80 ug via INTRAVENOUS
  Administered 2013-05-14 (×4): 40 ug via INTRAVENOUS
  Administered 2013-05-14: 80 ug via INTRAVENOUS
  Administered 2013-05-14: 40 ug via INTRAVENOUS

## 2013-05-14 MED ORDER — DIAZEPAM 5 MG PO TABS
5.0000 mg | ORAL_TABLET | Freq: Four times a day (QID) | ORAL | Status: DC | PRN
  Administered 2013-05-14 – 2013-05-16 (×2): 5 mg via ORAL
  Filled 2013-05-14 (×2): qty 1

## 2013-05-14 MED ORDER — BACITRACIN ZINC 500 UNIT/GM EX OINT
TOPICAL_OINTMENT | CUTANEOUS | Status: DC | PRN
  Administered 2013-05-14: 1 via TOPICAL

## 2013-05-14 MED ORDER — POLYVINYL ALCOHOL 1.4 % OP SOLN
1.0000 [drp] | Freq: Every day | OPHTHALMIC | Status: DC
  Administered 2013-05-14 – 2013-05-21 (×19): 1 [drp] via OPHTHALMIC
  Filled 2013-05-14: qty 15

## 2013-05-14 MED ORDER — GLYCOPYRROLATE 0.2 MG/ML IJ SOLN
INTRAMUSCULAR | Status: AC
  Filled 2013-05-14: qty 1

## 2013-05-14 MED ORDER — THROMBIN 20000 UNITS EX SOLR
CUTANEOUS | Status: DC | PRN
  Administered 2013-05-14: 09:00:00 via TOPICAL

## 2013-05-14 MED ORDER — LIDOCAINE HCL (CARDIAC) 20 MG/ML IV SOLN
INTRAVENOUS | Status: DC | PRN
  Administered 2013-05-14: 40 mg via INTRAVENOUS

## 2013-05-14 MED ORDER — ALUM & MAG HYDROXIDE-SIMETH 200-200-20 MG/5ML PO SUSP: 30.0000 mL | Freq: Four times a day (QID) | ORAL | Status: DC | PRN

## 2013-05-14 MED ORDER — OXYCODONE-ACETAMINOPHEN 5-325 MG PO TABS
1.0000 | ORAL_TABLET | ORAL | Status: DC | PRN
  Administered 2013-05-14 – 2013-05-15 (×2): 2 via ORAL
  Administered 2013-05-15: 1 via ORAL
  Administered 2013-05-15: 2 via ORAL
  Administered 2013-05-16 – 2013-05-18 (×7): 1 via ORAL
  Filled 2013-05-14 (×2): qty 1
  Filled 2013-05-14 (×4): qty 2
  Filled 2013-05-14 (×3): qty 1
  Filled 2013-05-14: qty 2
  Filled 2013-05-14 (×2): qty 1

## 2013-05-14 MED ORDER — EPHEDRINE SULFATE 50 MG/ML IJ SOLN
INTRAMUSCULAR | Status: DC | PRN
  Administered 2013-05-14 (×2): 10 mg via INTRAVENOUS

## 2013-05-14 MED ORDER — OXYCODONE HCL 5 MG PO TABS
5.0000 mg | ORAL_TABLET | Freq: Once | ORAL | Status: AC | PRN
  Administered 2013-05-14: 5 mg via ORAL

## 2013-05-14 MED ORDER — DOCUSATE SODIUM 100 MG PO CAPS
100.0000 mg | ORAL_CAPSULE | Freq: Two times a day (BID) | ORAL | Status: DC
  Administered 2013-05-14 – 2013-05-21 (×12): 100 mg via ORAL
  Filled 2013-05-14 (×13): qty 1

## 2013-05-14 MED ORDER — ESCITALOPRAM OXALATE 5 MG PO TABS
5.0000 mg | ORAL_TABLET | Freq: Every day | ORAL | Status: DC
  Administered 2013-05-14 – 2013-05-21 (×8): 5 mg via ORAL
  Filled 2013-05-14 (×8): qty 1

## 2013-05-14 MED ORDER — VANCOMYCIN HCL IN DEXTROSE 1-5 GM/200ML-% IV SOLN
1000.0000 mg | Freq: Once | INTRAVENOUS | Status: AC
  Administered 2013-05-14: 1000 mg via INTRAVENOUS
  Filled 2013-05-14 (×2): qty 200

## 2013-05-14 MED ORDER — ACETAMINOPHEN 650 MG RE SUPP: 650.0000 mg | RECTAL | Status: DC | PRN

## 2013-05-14 MED ORDER — ADULT MULTIVITAMIN W/MINERALS CH
1.0000 | ORAL_TABLET | Freq: Every day | ORAL | Status: DC
  Administered 2013-05-14 – 2013-05-21 (×8): 1 via ORAL
  Filled 2013-05-14 (×8): qty 1

## 2013-05-14 MED ORDER — PHENYLEPHRINE HCL 10 MG/ML IJ SOLN
10.0000 mg | INTRAVENOUS | Status: DC | PRN
  Administered 2013-05-14: 20 ug/min via INTRAVENOUS

## 2013-05-14 MED ORDER — EPHEDRINE SULFATE 50 MG/ML IJ SOLN
INTRAMUSCULAR | Status: AC
  Filled 2013-05-14: qty 1

## 2013-05-14 MED ORDER — LACTATED RINGERS IV SOLN: INTRAVENOUS | Status: DC

## 2013-05-14 MED ORDER — BUPIVACAINE LIPOSOME 1.3 % IJ SUSP
20.0000 mL | Freq: Once | INTRAMUSCULAR | Status: AC
  Administered 2013-05-14: 20 mL
  Filled 2013-05-14: qty 20

## 2013-05-14 MED ORDER — 0.9 % SODIUM CHLORIDE (POUR BTL) OPTIME
TOPICAL | Status: DC | PRN
  Administered 2013-05-14: 1000 mL

## 2013-05-14 MED ORDER — MORPHINE SULFATE 2 MG/ML IJ SOLN: 1.0000 mg | INTRAMUSCULAR | Status: DC | PRN

## 2013-05-14 MED ORDER — ONDANSETRON HCL 4 MG/2ML IJ SOLN
4.0000 mg | INTRAMUSCULAR | Status: DC | PRN
  Administered 2013-05-16: 4 mg via INTRAVENOUS
  Filled 2013-05-14: qty 2

## 2013-05-14 MED ORDER — OXYCODONE HCL 5 MG/5ML PO SOLN: 5.0000 mg | Freq: Once | ORAL | Status: AC | PRN

## 2013-05-14 SURGICAL SUPPLY — 70 items
ALLEVYN LIFE SACRUM DRESSING ×3 IMPLANT
BAG DECANTER FOR FLEXI CONT (MISCELLANEOUS) ×3 IMPLANT
BENZOIN TINCTURE PRP APPL 2/3 (GAUZE/BANDAGES/DRESSINGS) ×3 IMPLANT
BLADE SURG ROTATE 9660 (MISCELLANEOUS) IMPLANT
BRUSH SCRUB EZ PLAIN DRY (MISCELLANEOUS) ×3 IMPLANT
BUR ACORN 6.0 (BURR) ×2 IMPLANT
BUR ACORN 6.0MM (BURR) ×1
BUR MATCHSTICK NEURO 3.0 LAGG (BURR) ×3 IMPLANT
CANISTER SUCT 3000ML (MISCELLANEOUS) ×3 IMPLANT
CAP REVERE LOCKING (Cap) ×18 IMPLANT
CLOSURE WOUND 1/2 X4 (GAUZE/BANDAGES/DRESSINGS) ×1
CONT SPEC 4OZ CLIKSEAL STRL BL (MISCELLANEOUS) ×3 IMPLANT
COVER BACK TABLE 24X17X13 BIG (DRAPES) IMPLANT
COVER TABLE BACK 60X90 (DRAPES) ×3 IMPLANT
DRAPE C-ARM 42X72 X-RAY (DRAPES) ×6 IMPLANT
DRAPE LAPAROTOMY 100X72X124 (DRAPES) ×3 IMPLANT
DRAPE POUCH INSTRU U-SHP 10X18 (DRAPES) ×3 IMPLANT
DRAPE PROXIMA HALF (DRAPES) ×3 IMPLANT
DRAPE SURG 17X23 STRL (DRAPES) ×12 IMPLANT
ELECT BLADE 4.0 EZ CLEAN MEGAD (MISCELLANEOUS) ×3
ELECT REM PT RETURN 9FT ADLT (ELECTROSURGICAL) ×3
ELECTRODE BLDE 4.0 EZ CLN MEGD (MISCELLANEOUS) ×1 IMPLANT
ELECTRODE REM PT RTRN 9FT ADLT (ELECTROSURGICAL) ×1 IMPLANT
GAUZE SPONGE 4X4 16PLY XRAY LF (GAUZE/BANDAGES/DRESSINGS) ×3 IMPLANT
GLOVE BIO SURGEON STRL SZ8.5 (GLOVE) ×6 IMPLANT
GLOVE BIOGEL PI IND STRL 7.5 (GLOVE) ×2 IMPLANT
GLOVE BIOGEL PI INDICATOR 7.5 (GLOVE) ×4
GLOVE EXAM NITRILE LRG STRL (GLOVE) ×6 IMPLANT
GLOVE EXAM NITRILE MD LF STRL (GLOVE) IMPLANT
GLOVE EXAM NITRILE XL STR (GLOVE) IMPLANT
GLOVE EXAM NITRILE XS STR PU (GLOVE) IMPLANT
GLOVE INDICATOR 8.5 STRL (GLOVE) ×3 IMPLANT
GLOVE SS BIOGEL STRL SZ 8 (GLOVE) ×2 IMPLANT
GLOVE SS N UNI LF 7.0 STRL (GLOVE) ×12 IMPLANT
GLOVE SUPERSENSE BIOGEL SZ 8 (GLOVE) ×4
GOWN BRE IMP SLV AUR LG STRL (GOWN DISPOSABLE) IMPLANT
GOWN BRE IMP SLV AUR XL STRL (GOWN DISPOSABLE) IMPLANT
GOWN STRL REIN 2XL LVL4 (GOWN DISPOSABLE) IMPLANT
GOWN STRL REUS W/ TWL XL LVL3 (GOWN DISPOSABLE) ×4 IMPLANT
GOWN STRL REUS W/TWL XL LVL3 (GOWN DISPOSABLE) ×8
KIT BASIN OR (CUSTOM PROCEDURE TRAY) ×3 IMPLANT
KIT ROOM TURNOVER OR (KITS) ×3 IMPLANT
NEEDLE HYPO 21X1.5 SAFETY (NEEDLE) IMPLANT
NEEDLE HYPO 22GX1.5 SAFETY (NEEDLE) ×3 IMPLANT
NS IRRIG 1000ML POUR BTL (IV SOLUTION) ×3 IMPLANT
PACK FOAM VITOSS 10CC (Orthopedic Implant) ×3 IMPLANT
PACK LAMINECTOMY NEURO (CUSTOM PROCEDURE TRAY) ×3 IMPLANT
PAD ARMBOARD 7.5X6 YLW CONV (MISCELLANEOUS) ×9 IMPLANT
PATTIES SURGICAL .5 X1 (DISPOSABLE) IMPLANT
PUTTY ABX ACTIFUSE 20ML (Putty) ×3 IMPLANT
ROD REVERE 6.35 CURVED 70MM (Rod) ×6 IMPLANT
SCREW REVERE 6.35 6.5MMX45 (Screw) ×6 IMPLANT
SCREW REVERE 6.5X50MM (Screw) ×12 IMPLANT
SPACER SUSTAIN O SM 8X22 10M (Spacer) ×12 IMPLANT
SPONGE GAUZE 4X4 12PLY (GAUZE/BANDAGES/DRESSINGS) ×3 IMPLANT
SPONGE LAP 4X18 X RAY DECT (DISPOSABLE) IMPLANT
SPONGE NEURO XRAY DETECT 1X3 (DISPOSABLE) IMPLANT
SPONGE SURGIFOAM ABS GEL 100 (HEMOSTASIS) ×3 IMPLANT
STRIP CLOSURE SKIN 1/2X4 (GAUZE/BANDAGES/DRESSINGS) ×2 IMPLANT
SUT VIC AB 1 CT1 18XBRD ANBCTR (SUTURE) ×2 IMPLANT
SUT VIC AB 1 CT1 8-18 (SUTURE) ×4
SUT VIC AB 2-0 CP2 18 (SUTURE) ×6 IMPLANT
SYR 20CC LL (SYRINGE) IMPLANT
SYR 20ML ECCENTRIC (SYRINGE) ×3 IMPLANT
TAPE CLOTH SURG 4X10 WHT LF (GAUZE/BANDAGES/DRESSINGS) ×3 IMPLANT
TAPE STRIPS DRAPE STRL (GAUZE/BANDAGES/DRESSINGS) ×3 IMPLANT
TOWEL OR 17X24 6PK STRL BLUE (TOWEL DISPOSABLE) ×6 IMPLANT
TOWEL OR 17X26 10 PK STRL BLUE (TOWEL DISPOSABLE) ×3 IMPLANT
TRAY FOLEY CATH 14FRSI W/METER (CATHETERS) ×3 IMPLANT
WATER STERILE IRR 1000ML POUR (IV SOLUTION) ×3 IMPLANT

## 2013-05-14 NOTE — H&P (Signed)
Subjective: The patient is an 80 year old white female who's had chronic back, buttock, and leg pain consistent with neurogenic claudication. She has failed medical management and was worked up with a lumbar MRI and lumbar x-rays. This demonstrated a spondylolisthesis and spinal stenosis at L3-4 and L4-5. I discussed the various treatment option with the patient including surgery. She has weighed the risks, benefits, and alternatives surgery and decided proceed with a L3-4 and L4-5 decompression, agitation, and fusion.   Past Medical History  Diagnosis Date  . Colon polyps     Adenomatus and Hyperplastic  . Internal hemorrhoids   . Gastritis   . Arthritis   . TIA (transient ischemic attack)   . Irritable bowel   . Reflux esophagitis 2011  . Gastric ulcer     multiple linear ulcers in body of stomach  . Gastric polyps   . Antritis (stomach) 2003    erosive  . Allergy   . Blood transfusion   . Cancer     skin  . GERD (gastroesophageal reflux disease)   . Stroke     Past Surgical History  Procedure Laterality Date  . Colonoscopy  '88,'89,'93,'98'05    Dierdre Searles MD, [facility]  . Esophagogastroduodenoscopy  '02,'03,'05    Dierdre Searles MD, [facility]  . Appendectomy    . Total hip arthroplasty  '00    bilateral    Allergies  Allergen Reactions  . Penicillins Rash    RASH AND VAGINAL IRRITATION    History  Substance Use Topics  . Smoking status: Former Research scientist (life sciences)  . Smokeless tobacco: Never Used  . Alcohol Use: 8.4 oz/week    14 Glasses of wine per week     Comment: 2 glasses a day, occ martini or scotch    Family History  Problem Relation Age of Onset  . Colon polyps    . Breast cancer    . Heart disease     Prior to Admission medications   Medication Sig Start Date End Date Taking? Authorizing Provider  acetaminophen (TYLENOL) 500 MG tablet Take 500 mg by mouth every 6 (six) hours as needed for mild pain (takes 1-2 as needed but usually just takes  one).   Yes Historical Provider, MD  aspirin 81 MG tablet Take 81 mg by mouth daily.   Yes Historical Provider, MD  cholecalciferol (VITAMIN D) 1000 UNITS tablet Take 2,000 Units by mouth daily.    Yes Historical Provider, MD  docusate sodium (COLACE) 100 MG capsule Take 200 mg by mouth at bedtime.   Yes Historical Provider, MD  escitalopram (LEXAPRO) 10 MG tablet Take 5 mg by mouth daily.   Yes Historical Provider, MD  fish oil-omega-3 fatty acids 1000 MG capsule Take 1 g by mouth daily.   Yes Historical Provider, MD  magnesium hydroxide (MILK OF MAGNESIA) 400 MG/5ML suspension Take 30 mLs by mouth daily as needed for mild constipation.    Yes Historical Provider, MD  methylcellulose (ARTIFICIAL TEARS) 1 % ophthalmic solution Place 1 drop into both eyes as needed. Dry eyes.   Yes Historical Provider, MD  Misc Natural Products (NARCOSOFT HERBAL LAX PO) Take 1-2 tablets by mouth at bedtime as needed. constipation   Yes Historical Provider, MD  Multiple Vitamin (MULTIVITAMIN WITH MINERALS) TABS tablet Take 1 tablet by mouth daily.   Yes Historical Provider, MD  Probiotic Product ([place]) Take 1 capsule by mouth daily.    Yes Historical Provider, MD  Review of Systems  Positive ROS: As above  All other systems have been reviewed and were otherwise negative with the exception of those mentioned in the HPI and as above.  Objective: Vital signs in last 24 hours: Temp:  [97.7 F (36.5 C)] 97.7 F (36.5 C) (03/16 0612) Pulse Rate:  [68] 68 (03/16 0612) BP: (155)/(65) 155/65 mmHg (03/16 0612) SpO2:  [100 %] 100 % (03/16 0612)  General Appearance: Alert, cooperative, no distress, Head: Normocephalic, without obvious abnormality, atraumatic Eyes: PERRL, conjunctiva/corneas clear, EOM's intact,    Ears: Normal  Throat: Normal  Neck: Supple, symmetrical, trachea midline, no adenopathy; thyroid: No enlargement/tenderness/nodules; no carotid bruit or JVD Back: Symmetric, no  curvature, ROM normal, no CVA tenderness Lungs: Clear to auscultation bilaterally, respirations unlabored Heart: Regular rate and rhythm, no murmur, rub or gallop Abdomen: Soft, non-tender,, no masses, no organomegaly Extremities: Extremities normal, atraumatic, no cyanosis or edema Pulses: 2+ and symmetric all extremities Skin: Skin color, texture, turgor normal, no rashes or lesions  NEUROLOGIC:   Mental status: alert and oriented, no aphasia, good attention span, Fund of knowledge/ memory ok Motor Exam - grossly normal Sensory Exam - grossly normal Reflexes:  Coordination - grossly normal Gait - grossly normal Balance - grossly normal Cranial Nerves: I: smell Not tested  II: visual acuity  OS: Normal  OD: Normal   II: visual fields Full to confrontation  II: pupils Equal, round, reactive to light  III,VII: ptosis None  III,IV,VI: extraocular muscles  Full ROM  V: mastication Normal  V: facial light touch sensation  Normal  V,VII: corneal reflex  Present  VII: facial muscle function - upper  Normal  VII: facial muscle function - lower Normal  VIII: hearing Not tested  IX: soft palate elevation  Normal  IX,X: gag reflex Present  XI: trapezius strength  5/5  XI: sternocleidomastoid strength 5/5  XI: neck flexion strength  5/5  XII: tongue strength  Normal    Data Review Lab Results  Component Value Date   WBC 5.3 05/04/2013   HGB 12.9 05/04/2013   HCT 35.9* 05/04/2013   MCV 100.3* 05/04/2013   PLT 257 05/04/2013   Lab Results  Component Value Date   NA 135* 05/04/2013   K 5.3 05/04/2013   CL 99 05/04/2013   CO2 26 05/04/2013   BUN 17 05/04/2013   CREATININE 0.36* 05/04/2013   GLUCOSE 82 05/04/2013   Lab Results  Component Value Date   INR 1.0 03/19/2008    Assessment/Plan: L3-4 and L4-5 spondylolisthesis, spinal stenosis, lumbago, lumbar radiculopathy, neurogenic claudication. I discussed situation with the patient. I have reviewed her imaging studies with her and pointed out  the abnormalities. We have discussed the various treatment options including surgery. I have described the surgical treatment option of an L3-4 and L4-5 decompressive laminectomy, instrumentation, and fusion. I've shown her surgical models. We have discussed the risks, benefits, alternatives, and likelihood of achieving our goals with surgery. I've answered all the patient's questions. She has decided to proceed with surgery.   Nylah Butkus D 05/14/2013 7:15 AM

## 2013-05-14 NOTE — Anesthesia Procedure Notes (Signed)
Procedure Name: Intubation Date/Time: 05/14/2013 7:40 AM Performed by: Ollen Bowl Pre-anesthesia Checklist: Patient identified, Timeout performed, Emergency Drugs available, Suction available and Patient being monitored Patient Re-evaluated:Patient Re-evaluated prior to inductionOxygen Delivery Method: Circle system utilized and Simple face mask Preoxygenation: Pre-oxygenation with 100% oxygen Intubation Type: IV induction Ventilation: Mask ventilation without difficulty Laryngoscope Size: Miller and 2 Grade View: Grade II Tube type: Oral Tube size: 7.0 mm Number of attempts: 1 Airway Equipment and Method: Patient positioned with wedge pillow and Stylet Placement Confirmation: ETT inserted through vocal cords under direct vision,  positive ETCO2 and breath sounds checked- equal and bilateral Secured at: 21 cm Tube secured with: Tape Dental Injury: Teeth and Oropharynx as per pre-operative assessment

## 2013-05-14 NOTE — Anesthesia Postprocedure Evaluation (Signed)
  Anesthesia Post-op Note  Patient: Donna Cummings [facility]  Procedure(s) Performed: Procedure(s) with comments: POSTERIOR LUMBAR FUSION 2 LEVEL (N/A) - L3-4 L4-5 Decompressive Laminectomy/posterior lumbar interbody fusion/interbody prosthesis/pedicle screws/posterolateral arthrodesis  Patient Location: PACU  Anesthesia Type:General  Level of Consciousness: awake, alert , oriented and patient cooperative  Airway and Oxygen Therapy: Patient Spontanous Breathing and Patient connected to nasal cannula oxygen  Post-op Pain: mild  Post-op Assessment: Post-op Vital signs reviewed, Patient's Cardiovascular Status Stable, Respiratory Function Stable, Patent Airway, No signs of Nausea or vomiting and Pain level controlled  Post-op Vital Signs: Reviewed and stable  Complications: No apparent anesthesia complications

## 2013-05-14 NOTE — Op Note (Signed)
Brief history: The patient is an 80 year old white female who's had chronic back, buttock, and leg pain consistent with neurogenic claudication. She has failed medical management and was worked up with a lumbar MRI and lumbar x-rays. This demonstrated a spondylolisthesis at L3-4 and L4-5 with spinal stenosis. I discussed the various treatment option with the patient including surgery. She has weighed the risks, benefits, and alternatives surgery and decided proceed with an L3-4 and L4-5 decompression, instrumentation, and fusion.  Preoperative diagnosis: L3-4 and L4-5 spondylolisthesis Degenerative disc disease, spinal stenosis compressing the bilateral L3, L4 and L5 nerve roots; lumbago; lumbar radiculopathy  Postoperative diagnosis: The same and at L5 spondylolyses  Procedure: L4 laminectomy and Bilateral L3  Laminotomies/foraminotomies to decompress the bilateral L3, L4 and L5 nerve roots(the work required to do this was in addition to the work required to do the posterior lumbar interbody fusion because of the patient's spinal stenosis, facet arthropathy. Etc. requiring a wide decompression of the nerve roots.); L3-4 and L4-5 posterior lumbar interbody fusion with local morselized autograft bone and Actifusebone graft extender; insertion of interbody prosthesis at L3-4 and L4-5 (globus peek interbody prosthesis); posterior mental instrumentation from L3 to L5 with globus titanium pedicle screws and rods; posterior lateral arthrodesis at L3-4 and L4-5 with local morselized autograft bone and Vitoss bone graft extender.  Surgeon: Dr. Earle Gell  Asst.: Dr. Dominica Severin cram  Anesthesia: Gen. endotracheal  Estimated blood loss: 250 cc  Drains: One medium Hemovac  Complications: None  Description of procedure: The patient was brought to the operating room by the anesthesia team. General endotracheal anesthesia was induced. The patient was turned to the prone position on the Wilson frame. The patient's  lumbosacral region was then prepared with Betadine scrub and Betadine solution. Sterile drapes were applied.  I then injected the area to be incised with Marcaine with epinephrine solution. I then used the scalpel to make a linear midline incision over the L3-4, L4-5 and L5-S1 interspace. I then used electrocautery to perform a bilateral subperiosteal dissection exposing the spinous process and lamina of L2-L5. We then obtained intraoperative radiograph to confirm our location. We then inserted the Verstrac retractor to provide exposure. We noted that the patient's L5 lamina was quite mobile and appeared to have a spondylolyses. We considered including L5-S1 in the fusion but it was no instability on preoperative flexion-extension x-rays and no stenosis on the MRI scan, so we decided not to include this in the fusion. I incised interspinous ligament at L3-4 and L4-5 with a scalpel. I used Leksell nodule were to remove the spinous process at L4.  I began the decompression by using the high speed drill to perform laminotomies at L3 and L4 bilaterally. We then used the Kerrison punches to complete the L4 laminectomy and to widen the laminotomies at L3 bilaterally and removed the ligamentum flavum at L3-4 and L4-5. We used the Kerrison punches to remove the medial facets at L3-4 and L4-5. We performed wide foraminotomies about the bilateral L3, L4 and L5 nerve roots completing the decompression.  We now turned our attention to the posterior lumbar interbody fusion. I used a scalpel to incise the intervertebral disc at L3-4 and L4-5 bilaterally. I then performed a partial intervertebral discectomy at L3-4 and L4-5 bilaterally using the pituitary forceps. We prepared the vertebral endplates at W2-9 and H3-7 bilaterally for the fusion by removing the soft tissues with the curettes. We then used the trial spacers to pick the appropriate sized interbody prosthesis. We  prefilled his prosthesis with a combination of  local morselized autograft bone that we obtained during the decompression as well as Actifuse bone graft extender. We inserted the prefilled prosthesis into the interspace at L3-4 and L4-5 bilaterally. There was a good snug fit of the prosthesis in the interspace. We then filled and the remainder of the intervertebral disc space with local morselized autograft bone and Actifuse. This completed the posterior lumbar interbody arthrodesis.  We now turned attention to the instrumentation. Under fluoroscopic guidance we cannulated the bilateral L3, L4 and L5 pedicles with the bone probe. We then removed the bone probe. We then tapped the pedicle with a 5.5 millimeter tap. We then removed the tap. We probed inside the tapped pedicle with a ball probe to rule out cortical breaches. We then inserted a 6.5 x 50 and 45 millimeter pedicle screw into the L3, L4 and L5 pedicles bilaterally under fluoroscopic guidance. We then palpated along the medial aspect of the pedicles to rule out cortical breaches. There were none. The nerve roots were not injured. We then connected the unilateral pedicle screws with a lordotic rod. We compressed the construct and secured the rod in place with the caps. We then tightened the caps appropriately. This completed the instrumentation from L3-L5.  We now turned our attention to the posterior lateral arthrodesis at L3-4 and L4-5. We used the high-speed drill to decorticate the remainder of the facets, pars, transverse process at L3-4 and L4-5. We then applied a combination of local morselized autograft bone and Vitoss bone graft extender over these decorticated posterior lateral structures. This completed the posterior lateral arthrodesis.  We then obtained hemostasis using bipolar electrocautery. We irrigated the wound out with bacitracin solution. We inspected the thecal sac and nerve roots and noted they were well decompressed. We then removed the retractor. We placed a medium Hemovac  drain in the epidural space and tunneled out through separate stab wound. We reapproximated patient's thoracolumbar fascia with interrupted #1 Vicryl suture. We reapproximated patient's subcutaneous tissue with interrupted 2-0 Vicryl suture. The reapproximated patient's skin with Steri-Strips and benzoin. The wound was then coated with bacitracin ointment. A sterile dressing was applied. The drapes were removed. The patient was subsequently returned to the supine position where they were extubated by the anesthesia team. He was then transported to the post anesthesia care unit in stable condition. All sponge instrument and needle counts were reportedly correct at the end of this case.

## 2013-05-14 NOTE — Anesthesia Preprocedure Evaluation (Addendum)
Anesthesia Evaluation  Patient identified by MRN, date of birth, ID band Patient awake    Reviewed: Allergy & Precautions, H&P , NPO status , Patient's Chart, lab work & pertinent test results, reviewed documented beta blocker date and time   History of Anesthesia Complications Negative for: history of anesthetic complications  Airway Mallampati: II TM Distance: >3 FB Neck ROM: Full    Dental  (+) Teeth Intact, Partial Upper   Pulmonary former smoker (quit '80),  breath sounds clear to auscultation        Cardiovascular - anginanegative cardio ROS  Rhythm:Regular Rate:Normal     Neuro/Psych TIA   GI/Hepatic Neg liver ROS, GERD-  Controlled,  Endo/Other  negative endocrine ROS  Renal/GU negative Renal ROS     Musculoskeletal   Abdominal   Peds  Hematology negative hematology ROS (+)   Anesthesia Other Findings   Reproductive/Obstetrics                          Anesthesia Physical Anesthesia Plan  ASA: II  Anesthesia Plan: General   Post-op Pain Management:    Induction: Intravenous  Airway Management Planned: Oral ETT  Additional Equipment:   Intra-op Plan:   Post-operative Plan: Extubation in OR  Informed Consent: I have reviewed the patients History and Physical, chart, labs and discussed the procedure including the risks, benefits and alternatives for the proposed anesthesia with the patient or authorized representative who has indicated his/her understanding and acceptance.   Dental advisory given  Plan Discussed with: CRNA, Anesthesiologist and Surgeon  Anesthesia Plan Comments: (Plan routine monitors, GETA)       Anesthesia Quick Evaluation

## 2013-05-14 NOTE — Transfer of Care (Signed)
Immediate Anesthesia Transfer of Care Note  Patient: Donna Cummings [facility]  Procedure(s) Performed: Procedure(s) with comments: POSTERIOR LUMBAR FUSION 2 LEVEL (N/A) - L3-4 L4-5 Decompressive Laminectomy/posterior lumbar interbody fusion/interbody prosthesis/pedicle screws/posterolateral arthrodesis  Patient Location: PACU  Anesthesia Type:General  Level of Consciousness: awake and alert   Airway & Oxygen Therapy: Patient Spontanous Breathing and Patient connected to nasal cannula oxygen  Post-op Assessment: Report given to PACU RN, Post -op Vital signs reviewed and stable and Patient moving all extremities X 4  Post vital signs: Reviewed and stable  Complications: No apparent anesthesia complications

## 2013-05-14 NOTE — Preoperative (Signed)
Beta Blockers   Reason not to administer Beta Blockers:Not Applicable 

## 2013-05-14 NOTE — Progress Notes (Signed)
Utilization review completed.  

## 2013-05-14 NOTE — Progress Notes (Signed)
Patient ID: Donna Cummings, female   DOB: 23-Feb-1931, 80 y.o.   MRN: [id number] Subjective:  The patient is somnolent but easily arousable. She is in no apparent distress.  Objective: Vital signs in last 24 hours: Temp:  [97.7 F (36.5 C)] 97.7 F (36.5 C) (03/16 1305) Pulse Rate:  [68-87] 87 (03/16 1305) Resp:  [18] 18 (03/16 1305) BP: (106-155)/(57-65) 106/57 mmHg (03/16 1305) SpO2:  [100 %] 100 % (03/16 1305)  Intake/Output from previous day:   Intake/Output this shift: Total I/O In: 2000 [I.V.:2000] Out: 555 [Urine:355; Blood:200]  Physical exam patient is somewhat but arousable. She is moving her lower extremities well.  Lab Results: No results found for this basename: WBC, HGB, HCT, PLT,  in the last 72 hours BMET No results found for this basename: NA, K, CL, CO2, GLUCOSE, BUN, CREATININE, CALCIUM,  in the last 72 hours  Studies/Results: Dg Lumbar Spine 2-3 Views  05/14/2013   CLINICAL DATA:  PLIF.  EXAM: LUMBAR SPINE - 2-3 VIEW; DG C-ARM 1-60 MIN  COMPARISON:  Intraoperative images same date.  FINDINGS: Spot fluoroscopic images of the lower lumbar spine demonstrate the placement of bilateral pedicle screws at L3, L4 and L5. The interconnecting rods have not yet been placed. Interbody spacers at both levels appear well positioned. There is a persistent anterolisthesis at L4-5.  IMPRESSION: Intraoperative views during L3-5 PLIF. No demonstrated complication.   Electronically Signed   By: Camie Patience M.D.   On: 05/14/2013 12:29   Dg Lumbar Spine 1 View  05/14/2013   CLINICAL DATA:  L3 - L5 PLIF  EXAM: LUMBAR SPINE - 1 VIEW  COMPARISON:  MR L SPINE W/O dated 01/05/2005  FINDINGS: A single spot lateral projection radiographic images of the lumbar spine is provided for review.  Spinal labeling is in keeping with prior lumbar spine MRI.  A radiopaque marking instrument overlies the soft tissues just caudal to the L4-L5 intervertebral disc space. Additional radiopaque surgical support  apparatus is seen cranial to the localization site. No definite radiopaque foreign body.  Presumed enteric tube projects over the gastric fundus. Post bilateral total hip replacements, incompletely evaluated.  IMPRESSION: Intraoperative localization as above.   Electronically Signed   By: Sandi Mariscal M.D.   On: 05/14/2013 10:27   Dg C-arm 1-60 Min  05/14/2013   CLINICAL DATA:  PLIF.  EXAM: LUMBAR SPINE - 2-3 VIEW; DG C-ARM 1-60 MIN  COMPARISON:  Intraoperative images same date.  FINDINGS: Spot fluoroscopic images of the lower lumbar spine demonstrate the placement of bilateral pedicle screws at L3, L4 and L5. The interconnecting rods have not yet been placed. Interbody spacers at both levels appear well positioned. There is a persistent anterolisthesis at L4-5.  IMPRESSION: Intraoperative views during L3-5 PLIF. No demonstrated complication.   Electronically Signed   By: Camie Patience M.D.   On: 05/14/2013 12:29    Assessment/Plan: The patient is doing well.  LOS: 0 days     Alsha Meland D 05/14/2013, 1:30 PM

## 2013-05-15 LAB — CBC
HCT: 24.5 % — ABNORMAL LOW (ref 36.0–46.0)
Hemoglobin: 9.1 g/dL — ABNORMAL LOW (ref 12.0–15.0)
MCH: 37 pg — ABNORMAL HIGH (ref 26.0–34.0)
MCHC: 37 g/dL — AB (ref 30.0–36.0)
MCV: 99.6 fL (ref 78.0–100.0)
PLATELETS: 171 10*3/uL (ref 150–400)
RBC: 2.46 MIL/uL — ABNORMAL LOW (ref 3.87–5.11)
RDW: 13 % (ref 11.5–15.5)
WBC: 5.3 10*3/uL (ref 4.0–10.5)

## 2013-05-15 LAB — BASIC METABOLIC PANEL
BUN: 8 mg/dL (ref 6–23)
CALCIUM: 8.9 mg/dL (ref 8.4–10.5)
CO2: 25 mEq/L (ref 19–32)
CREATININE: 0.4 mg/dL — AB (ref 0.50–1.10)
Chloride: 97 mEq/L (ref 96–112)
Glucose, Bld: 91 mg/dL (ref 70–99)
Potassium: 4.1 mEq/L (ref 3.7–5.3)
Sodium: 133 mEq/L — ABNORMAL LOW (ref 137–147)

## 2013-05-15 MED ORDER — SODIUM CHLORIDE 0.9 % IV BOLUS (SEPSIS): 500.0000 mL | Freq: Once | INTRAVENOUS | Status: DC

## 2013-05-15 MED FILL — Heparin Sodium (Porcine) Inj 1000 Unit/ML: INTRAMUSCULAR | Qty: 30 | Status: AC

## 2013-05-15 MED FILL — Sodium Chloride IV Soln 0.9%: INTRAVENOUS | Qty: 1000 | Status: AC

## 2013-05-15 NOTE — Progress Notes (Signed)
PT Cancellation Note  Patient Details Name: Donna Cummings MRN: [id number] DOB: [birth year]   Cancelled Treatment:    Reason Eval/Treat Not Completed: Medical issues which prohibited therapy (per nsg, BP 75/37 at this time, Will Hold PT until medically appropriate)   Duncan Dull 05/15/2013, 8:45 AM Alben Deeds, PT DPT  [phone]

## 2013-05-15 NOTE — Evaluation (Addendum)
Occupational Therapy Evaluation Patient Details Name: Donna Cummings MRN: [id number] DOB: [birth year] Today's Date: 05/15/2013 Time: 9629-5284 OT Time Calculation (min): 13 min  OT Assessment / Plan / Recommendation History of present illness 80 y.o. s/p POSTERIOR LUMBAR FUSION 2 LEVEL (N/A) - L3-4 L4-5 Decompressive Laminectomy/posterior lumbar interbody fusion/interbody prosthesis/pedicle screws/posterolateral arthrodesis   Clinical Impression   Pt presents with below problem list. Feel pt will benefit from acute OT to increase independence prior to d/c. Limited evaluation due to low BP. Spoke with nurse prior to session who had called doctor about pt's BP and nurse verified pt okay to get up.     OT Assessment  Patient needs continued OT Services    Follow Up Recommendations  SNF- pt prefers [facility]   Barriers to Discharge Decreased caregiver support    Equipment Recommendations  Other (comment) (tbd)    Recommendations for Other Services    Frequency  Min 2X/week    Precautions / Restrictions Precautions Precautions: Back;Fall Precaution Comments: Educated pt on back precautions. Required Braces or Orthoses: Spinal Brace Spinal Brace: Lumbar corset;Applied in sitting position Restrictions Weight Bearing Restrictions: No   Pertinent Vitals/Pain Pain indicated, but not rated. Repositioned. BP 44/26 then 69/35 once back in supine position. Nurse notified and came in room.     ADL  Lower Body Dressing: Maximal assistance Where Assessed - Lower Body Dressing: Rolling right and/or left Toilet Transfer: Minimal assistance Toilet Transfer Method: Other (comment) (rolling on bed pan) Toilet Transfer Equipment: Other (comment) (bed pan) Tub/Shower Transfer Method: Not assessed Transfers/Ambulation Related to ADLs: Not assessed ADL Comments: Educated on back precautions. Pt sitting EOB for a little time and before cuff could get read Bp, pt having to lay back down.     OT  Diagnosis: Acute pain  OT Problem List: Decreased strength;Decreased activity tolerance;Decreased knowledge of use of DME or AE;Decreased knowledge of precautions;Pain OT Treatment Interventions: Self-care/ADL training;DME and/or AE instruction;Therapeutic activities;Patient/family education;Balance training   OT Goals(Current goals can be found in the care plan section) Acute Rehab OT Goals Patient Stated Goal: not stated OT Goal Formulation: With patient Time For Goal Achievement: 05/22/13 Potential to Achieve Goals: Good ADL Goals Pt Will Perform Grooming: with supervision;standing Pt Will Perform Lower Body Dressing: with supervision;sit to/from stand Pt Will Transfer to Toilet: with supervision;ambulating Pt Will Perform Toileting - Clothing Manipulation and hygiene: with supervision;sit to/from stand Additional ADL Goal #1: Pt will independently verbalize and demonstrate 3/3 back precautions.    Visit Information  Last OT Received On: 05/15/13 Assistance Needed: +1 (+2 for safety with ambulation) History of Present Illness: 80 y.o. s/p POSTERIOR LUMBAR FUSION 2 LEVEL (N/A) - L3-4 L4-5 Decompressive Laminectomy/posterior lumbar interbody fusion/interbody prosthesis/pedicle screws/posterolateral arthrodesis       Prior [place] expects to be discharged to:: Private residence Living Arrangements: Alone Type of Home: Other(Comment) (condo) Home Layout: Two level;Able to live on main level with bedroom/bathroom Prior Function Level of Independence: Independent Communication Communication: No difficulties         Vision/Perception     Cognition  Cognition Arousal/Alertness: Awake/alert Behavior During Therapy: WFL for tasks assessed/performed Overall Cognitive Status: Within Functional Limits for tasks assessed    Extremity/Trunk Assessment Upper Extremity Assessment Upper Extremity Assessment: Overall WFL for tasks assessed Lower  Extremity Assessment Lower Extremity Assessment: Defer to PT evaluation     Mobility Bed Mobility Overal bed mobility: Needs Assistance Bed Mobility: Rolling;Sidelying to Sit;Sit to Sidelying Rolling:  Min assist;Total assist Sidelying to sit: Max assist Sit to sidelying: Max assist General bed mobility comments: Cues for technique. Pt sitting EOB and having to lay down due to low BP requring a lot of assistance to go back to sidelying position/rolling.     Exercise     Balance     End of Session OT - End of Session Activity Tolerance: Treatment limited secondary to medical complications (Comment) (BP) Patient left: in bed;with nursing/sitter in room Nurse Communication: Other (comment) (called nurse to come in immediately due to )  GO      Benito Mccreedy OTR/L [phone] 05/15/2013, 11:42 AM

## 2013-05-15 NOTE — Progress Notes (Signed)
Patient ID: Donna Cummings, female   DOB: 08-04-1930, 80 y.o.   MRN: [id number] Subjective:  The patient is alert and pleasant. Her back is appropriately sore.  Objective: Vital signs in last 24 hours: Temp:  [97.3 F (36.3 C)-98.3 F (36.8 C)] 97.8 F (36.6 C) (03/17 0153) Pulse Rate:  [56-90] 66 (03/17 0640) Resp:  [7-33] 16 (03/17 0153) BP: (65-115)/(33-57) 97/41 mmHg (03/17 0640) SpO2:  [96 %-100 %] 98 % (03/17 0640) Weight:  [42.638 kg (94 lb)] 42.638 kg (94 lb) (03/16 1540)  Intake/Output from previous day: 03/16 0701 - 03/17 0700 In: 2830 [P.O.:30; I.V.:2800] Out: 1355 [Urine:705; Drains:450; Blood:200] Intake/Output this shift:    Physical exam patient is alert and oriented. She is moving her lower extremities well.  Lab Results: No results found for this basename: WBC, HGB, HCT, PLT,  in the last 72 hours BMET  Recent Labs  05/15/13 0542  NA 133*  K 4.1  CL 97  CO2 25  GLUCOSE 91  BUN 8  CREATININE 0.40*  CALCIUM 8.9    Studies/Results: Dg Lumbar Spine 2-3 Views  05/14/2013   CLINICAL DATA:  PLIF.  EXAM: LUMBAR SPINE - 2-3 VIEW; DG C-ARM 1-60 MIN  COMPARISON:  Intraoperative images same date.  FINDINGS: Spot fluoroscopic images of the lower lumbar spine demonstrate the placement of bilateral pedicle screws at L3, L4 and L5. The interconnecting rods have not yet been placed. Interbody spacers at both levels appear well positioned. There is a persistent anterolisthesis at L4-5.  IMPRESSION: Intraoperative views during L3-5 PLIF. No demonstrated complication.   Electronically Signed   By: Camie Patience M.D.   On: 05/14/2013 12:29   Dg Lumbar Spine 1 View  05/14/2013   CLINICAL DATA:  L3 - L5 PLIF  EXAM: LUMBAR SPINE - 1 VIEW  COMPARISON:  MR L SPINE W/O dated 01/05/2005  FINDINGS: A single spot lateral projection radiographic images of the lumbar spine is provided for review.  Spinal labeling is in keeping with prior lumbar spine MRI.  A radiopaque marking instrument  overlies the soft tissues just caudal to the L4-L5 intervertebral disc space. Additional radiopaque surgical support apparatus is seen cranial to the localization site. No definite radiopaque foreign body.  Presumed enteric tube projects over the gastric fundus. Post bilateral total hip replacements, incompletely evaluated.  IMPRESSION: Intraoperative localization as above.   Electronically Signed   By: Sandi Mariscal M.D.   On: 05/14/2013 10:27   Dg C-arm 1-60 Min  05/14/2013   CLINICAL DATA:  PLIF.  EXAM: LUMBAR SPINE - 2-3 VIEW; DG C-ARM 1-60 MIN  COMPARISON:  Intraoperative images same date.  FINDINGS: Spot fluoroscopic images of the lower lumbar spine demonstrate the placement of bilateral pedicle screws at L3, L4 and L5. The interconnecting rods have not yet been placed. Interbody spacers at both levels appear well positioned. There is a persistent anterolisthesis at L4-5.  IMPRESSION: Intraoperative views during L3-5 PLIF. No demonstrated complication.   Electronically Signed   By: Camie Patience M.D.   On: 05/14/2013 12:29    Assessment/Plan: Postop day #1: We will mobilize the patient with PT and OT.  LOS: 1 day     Delroy Ordway D 05/15/2013, 8:10 AM

## 2013-05-15 NOTE — Progress Notes (Signed)
Pt unable to void after foley d/c this am.  States feels need to void but unable. Bladder scanned for >900cc.  I&O cath obtained 1000cc urine.    Will continue to monitor

## 2013-05-15 NOTE — Significant Event (Signed)
Rapid Response Event Note  Overview: Time Called: 0601 Arrival Time: 0607 Event Type: Hypotension  Initial Focused Assessment: Called by bedside RN regarding SBP 60s while attempting to ambulate patient for the first time after lumbar surgery. Patient assisted back to bed by bedside RN. Patient did not lose consciousness per bedside RN. Patient complained of dizziness and abdominal pain during event. Upon arrival to floor, patient in bed, lethargic but arouses easily, oriented times 4, skin warm and dry. No new drainage noted on lumbar dressing, abdomen slightly distended. No shortness of breath or increased work of breathing noted. Patient complains of 'gas pains'. See doc flowsheet for vitals  Interventions: 500 mL NS bolus initiated per protocol. MD notified by bedside RN. BP 92/37 after half of bolus received, HR 60, 97% on RA. Patient more alert. Will continue to monitor, advised bedside RN to call with further needs.  Event Summary: Name of Physician Notified: Saintclair Halsted, MD at [phone]    at    Outcome: Stayed in room and stabalized  Event End Time: 0630  Donna Cummings

## 2013-05-15 NOTE — Progress Notes (Signed)
Rapid response at the bedside. Patient became light headed and c/o of abdominal discomfort and started to appear as if she was going to have a syncopal episode. BP low. (refer to notes). Fluid bolus in progress. Will continue to monitor and report to oncoming nurse.

## 2013-05-15 NOTE — Progress Notes (Signed)
PT Cancellation Note  Patient Details Name: Donna Cummings MRN: [id number] DOB: 1930-07-19   Cancelled Treatment:    Reason Eval/Treat Not Completed: Medical issues which prohibited therapy (attempted x2 per nsg still Hold therapy today, see tomorrow)   Duncan Dull 05/15/2013, 2:47 PM Alben Deeds, [place] DPT  [phone]

## 2013-05-16 NOTE — Progress Notes (Signed)
Patient will need to be mobilized today. Pivoted to the bedside commode with moderate assistance but patient requires a lot of motivation and queuing. Complaining of neck pain and stiffness. Encouraged to ambulate today with PT and PRN given. Will continue to monitor per shift.

## 2013-05-16 NOTE — Progress Notes (Signed)
Pt c/o neck pain and headache reported to PT in am medicated with percocet and warm pack applied. Pt told to call if pain persist . Pt reported relief of back and headache but neck pain medicated with valium for neck spasm at 1356. md jenken paged but in [place]. RN will continue to monitor pt .

## 2013-05-16 NOTE — Progress Notes (Signed)
Pt verbalized relief of  Neck pain at this time

## 2013-05-16 NOTE — Progress Notes (Signed)
Occupational Therapy Treatment Patient Details Name: Donna Cummings MRN: [id number] DOB: 04-Sep-1930 Today's Date: 05/16/2013 Time: 5361-4431 OT Time Calculation (min): 26 min  OT Assessment / Plan / Recommendation  History of present illness 80 y.o. s/p POSTERIOR LUMBAR FUSION 2 LEVEL (N/A) - L3-4 L4-5 Decompressive Laminectomy/posterior lumbar interbody fusion/interbody prosthesis/pedicle screws/posterolateral arthrodesis   OT comments  Pt progressing today. Practiced with AE and performed grooming/bathing/ toilet transfer.  Follow Up Recommendations  SNF    Barriers to Discharge       Equipment Recommendations  Other (comment) (tbd)    Recommendations for Other Services    Frequency Min 2X/week   Progress towards OT Goals Progress towards OT goals: Progressing toward goals  Plan Discharge plan remains appropriate    Precautions / Restrictions Precautions Precautions: Back;Fall Precaution Comments: Reviewed precautions with pt Required Braces or Orthoses: Spinal Brace Spinal Brace: Lumbar corset;Applied in sitting position Restrictions Weight Bearing Restrictions: No   Pertinent Vitals/Pain Pain in neck and back. Repositioned.     ADL  Grooming: Wash/dry hands;Wash/dry face;Teeth care;Minimal assistance (Min A for balance) Where Assessed - Grooming: Supported standing Upper Body Bathing: Min guard Where Assessed - Upper Body Bathing:  (sitting on 3 in 1) Lower Body Bathing: Moderate assistance Where Assessed - Lower Body Bathing: Supported standing Upper Body Dressing: Maximal assistance Where Assessed - Upper Body Dressing: Supported sitting Lower Body Dressing: Moderate assistance Where Assessed - Lower Body Dressing: Supported sitting (socks) Toilet Transfer: Minimal assistance;Min guard Toilet Transfer Method: Sit to stand Toilet Transfer Equipment: Raised toilet seat with arms (or 3-in-1 over toilet);Bedside commode Equipment Used: Gait belt;Back brace;Rolling  walker;Reacher;Long-handled sponge;Sock aid Transfers/Ambulation Related to ADLs: Min A  ADL Comments: Educated on use of cup for teeth care and cues given for precautions during grooming at sink. Practiced with reacher and sockaid.  Educated on long handled sponge for LB bathing. Pt performed bathing at sink in bathroom.     OT Diagnosis:    OT Problem List:   OT Treatment Interventions:     OT Goals(current goals can now be found in the care plan section) Acute Rehab OT Goals Patient Stated Goal: not stated OT Goal Formulation: With patient Time For Goal Achievement: 05/22/13 Potential to Achieve Goals: Good ADL Goals Pt Will Perform Grooming: with supervision;standing Pt Will Perform Upper Body Bathing: with supervision;sitting (including gathering supplies) Pt Will Perform Lower Body Bathing: with supervision;sit to/from stand;with adaptive equipment Pt Will Perform Lower Body Dressing: with supervision;sit to/from stand Pt Will Transfer to Toilet: with supervision;ambulating Pt Will Perform Toileting - Clothing Manipulation and hygiene: with supervision;sit to/from stand Additional ADL Goal #1: Pt will independently verbalize and demonstrate 3/3 back precautions.    Visit Information  Last OT Received On: 05/16/13 Assistance Needed: +1 History of Present Illness: 80 y.o. s/p POSTERIOR LUMBAR FUSION 2 LEVEL (N/A) - L3-4 L4-5 Decompressive Laminectomy/posterior lumbar interbody fusion/interbody prosthesis/pedicle screws/posterolateral arthrodesis    Subjective Data      Prior Functioning       Cognition  Cognition Arousal/Alertness: Awake/alert;Lethargic (lethargic at times) Behavior During Therapy: WFL for tasks assessed/performed Overall Cognitive Status: Within Functional Limits for tasks assessed    Mobility  Transfers Overall transfer level: Needs assistance Equipment used: Rolling walker (2 wheeled) Transfers: Sit to/from Stand Sit to Stand: Min assist;Min  guard General transfer comment: Cues for technique.     Exercises      Balance    End of Session OT - End of Session Equipment Utilized During  Treatment: Gait belt;Rolling walker;Back brace Activity Tolerance: Patient limited by pain Patient left: in chair;Other (comment);with call bell/phone within reach (nursing came in to fix drain) Nurse Communication: Other (comment) (drain disconnected)  GO     Benito Mccreedy OTR/L [phone] 05/16/2013, 2:15 PM

## 2013-05-16 NOTE — Progress Notes (Signed)
Patient ID: Donna Cummings, female   DOB: 1931/02/03, 80 y.o.   MRN: [id number] Subjective:  The patient is alert and pleasant. She looks well. She wants to go to [facility].  Objective: Vital signs in last 24 hours: Temp:  [97.6 F (36.4 C)-98.8 F (37.1 C)] 97.6 F (36.4 C) (03/18 1115) Pulse Rate:  [68-84] 68 (03/18 1115) Resp:  [18-20] 18 (03/18 1115) BP: (96-116)/(36-64) 96/36 mmHg (03/18 1115) SpO2:  [97 %-99 %] 98 % (03/18 1115)  Intake/Output from previous day: 03/17 0701 - 03/18 0700 In: -  Out: 2010 [Urine:1500; Drains:510] Intake/Output this shift: Total I/O In: -  Out: 170 [Drains:170]  Physical exam patient is alert and oriented. She is moving her lower extremities well.  Lab Results:  Recent Labs  05/15/13 0911  WBC 5.3  HGB 9.1*  HCT 24.5*  PLT 171   BMET  Recent Labs  05/15/13 0542  NA 133*  K 4.1  CL 97  CO2 25  GLUCOSE 91  BUN 8  CREATININE 0.40*  CALCIUM 8.9    Studies/Results: No results found.  Assessment/Plan: Postop day #2: The patient is doing well. We will arrange for to [facility].   LOS: 2 days     Welles Walthall D 05/16/2013, 4:02 PM

## 2013-05-16 NOTE — Evaluation (Signed)
Physical Therapy Evaluation Patient Details Name: Donna Cummings MRN: [id number] DOB: [birth year] Today's Date: 05/16/2013 Time: 9379-0240 PT Time Calculation (min): 27 min  PT Assessment / Plan / Recommendation History of Present Illness  80 y.o. s/p POSTERIOR LUMBAR FUSION 2 LEVEL (N/A) - L3-4 L4-5 Decompressive Laminectomy/posterior lumbar interbody fusion/interbody prosthesis/pedicle screws/posterolateral arthrodesis  Clinical Impression  Patient demonstrates deficits as indicated. Will need acute skilled PT.  Recommend SNF upon discharge. Patient BPs monitored throughout session (see vitals). Patient with increased pain today but BP stable. Will continue to see as indicated and progress as tolerated.    PT Assessment  Patient needs continued PT services    Follow Up Recommendations  SNF    Does the patient have the potential to tolerate intense rehabilitation      Barriers to Discharge Decreased caregiver support      Equipment Recommendations  Rolling walker with 5" wheels;3in1 (PT)    Recommendations for Other Services     Frequency Min 5X/week    Precautions / Restrictions Precautions Precautions: Back;Fall Precaution Comments: Educated pt on back precautions extensivly with teach back. Required Braces or Orthoses: Spinal Brace Spinal Brace: Lumbar corset;Applied in sitting position Restrictions Weight Bearing Restrictions: No   Pertinent Vitals/Pain 7/10      Mobility  Bed Mobility Overal bed mobility: Needs Assistance Bed Mobility: Rolling;Sidelying to Sit Rolling: Min assist;Total assist Sidelying to sit: Mod assist General bed mobility comments: VCs for technique, assist to elevate trunk to upright, cues for safety Transfers Overall transfer level: Needs assistance Equipment used: Rolling walker (2 wheeled) Transfers: Sit to/from Stand Sit to Stand: Min assist General transfer comment: VCs for hand placement, assist for stability and to come to  upright position, increased pain with mobility.  Patient very anxious secondary to pain. Multiple VCs for compliance with precautions Ambulation/Gait Ambulation/Gait assistance: Min assist;Min guard Ambulation Distance (Feet): 18 Feet Assistive device: Rolling walker (2 wheeled) Gait Pattern/deviations: Step-to pattern;Decreased stride length;Narrow base of support Gait velocity: decreased Gait velocity interpretation: <1.8 ft/sec, indicative of risk for recurrent falls General Gait Details: patient unsteady with ambulation, gnerealized weakness evidient and pimited secondary to increased pain.    Exercises     PT Diagnosis: Difficulty walking;Abnormality of gait;Generalized weakness;Acute pain  PT Problem List: Decreased strength;Decreased range of motion;Decreased activity tolerance;Decreased balance;Decreased mobility;Decreased knowledge of use of DME;Pain PT Treatment Interventions: DME instruction;Gait training;Stair training;Functional mobility training;Therapeutic activities;Therapeutic exercise;Balance training;Patient/family education     PT Goals(Current goals can be found in the care plan section) Acute Rehab PT Goals Patient Stated Goal: not stated PT Goal Formulation: With patient Time For Goal Achievement: 05/30/13 Potential to Achieve Goals: Good  Visit Information  Last PT Received On: 05/16/13 Assistance Needed: +1 History of Present Illness: 80 y.o. s/p POSTERIOR LUMBAR FUSION 2 LEVEL (N/A) - L3-4 L4-5 Decompressive Laminectomy/posterior lumbar interbody fusion/interbody prosthesis/pedicle screws/posterolateral arthrodesis       Prior [place] expects to be discharged to:: Private residence Living Arrangements: Alone Available Help at Discharge: Family;Available PRN/intermittently Type of Home: Other(Comment) (condo) Home Access: Stairs to enter Entrance Stairs-Number of Steps: 2 Home Layout: Two level;Able to live on main level  with bedroom/bathroom Prior Function Level of Independence: Independent Communication Communication: No difficulties Dominant Hand: Right    Cognition  Cognition Arousal/Alertness: Awake/alert Behavior During Therapy: WFL for tasks assessed/performed;Impulsive Overall Cognitive Status: Within Functional Limits for tasks assessed    Extremity/Trunk Assessment Upper Extremity Assessment Upper Extremity Assessment: Overall WFL for tasks assessed Lower  Extremity Assessment Lower Extremity Assessment: Generalized weakness   Balance Balance Overall balance assessment: Needs assistance Sitting-balance support: Feet supported;Single extremity supported Sitting balance-Leahy Scale: Good Sitting balance - Comments: increased pain in sitting Standing balance support: During functional activity;Bilateral upper extremity supported Standing balance-Leahy Scale: Good High level balance activites: Side stepping;Backward walking;Direction changes;Turns High Level Balance Comments: VCs for compliance with precautions during higher level mobility  End of Session PT - End of Session Equipment Utilized During Treatment: Gait belt;Back brace Activity Tolerance: Patient limited by pain Patient left: in chair;with call bell/phone within reach;with chair alarm set Nurse Communication: Mobility status;Precautions  GP     Duncan Dull 05/16/2013, 10:45 AM Alben Deeds, PT DPT  [phone]

## 2013-05-17 NOTE — Clinical Social Work Psychosocial (Signed)
Clinical Social Work Department BRIEF PSYCHOSOCIAL ASSESSMENT 05/17/2013  Patient:  Donna Cummings, Donna Cummings     Account Number:  [id number]     Admit date:  05/14/2013  Clinical Social Worker:  Daiva Huge  Date/Time:  05/17/2013 01:56 PM  Referred by:  Physician  Date Referred:  05/17/2013 Referred for  SNF Placement   Other Referral:   Interview type:  Patient Other interview type:    PSYCHOSOCIAL DATA Living Status:  ALONE Admitted from facility:   Level of care:  [place] Primary support name:  adult children Primary support relationship to patient:  FAMILY Degree of support available:   good    CURRENT CONCERNS Current Concerns  Post-Acute Placement   Other Concerns:    SOCIAL WORK ASSESSMENT / PLAN Patient admitted from home and is requestg in SNF rehab at d/c- she has already researched area SNF's and wants to go to [facility]- This is near her friends/family-   Assessment/plan status:  Other - See comment Other assessment/ plan:   FL2 and PASARR for SNF search   Information/referral to community resources:   SNF List    PATIENT'S/FAMILY'S RESPONSE TO PLAN OF CARE: Patient is hopeful for SNF rehab at [facility] and I am working on this- she states she is eager to get moved to rehab and get home soon-   Reggie Welge, MSW, [place]

## 2013-05-17 NOTE — Progress Notes (Signed)
Physical Therapy Treatment Patient Details Name: Donna Cummings MRN: [id number] DOB: [birth year] Today's Date: 05/17/2013 Time: 9381-0175 PT Time Calculation (min): 24 min  PT Assessment / Plan / Recommendation  History of Present Illness 80 y.o. s/p POSTERIOR LUMBAR FUSION 2 LEVEL (N/A) - L3-4 L4-5 Decompressive Laminectomy/posterior lumbar interbody fusion/interbody prosthesis/pedicle screws/posterolateral arthrodesis   PT Comments   Patient progressing with ambulation. Still demonstrates overall deficits. Continue to rec snf  Follow Up Recommendations  SNF     Does the patient have the potential to tolerate intense rehabilitation     Barriers to Discharge        Equipment Recommendations  Rolling walker with 5" wheels;3in1 (PT)    Recommendations for Other Services    Frequency Min 5X/week   Progress towards PT Goals Progress towards PT goals: Progressing toward goals  Plan Current plan remains appropriate    Precautions / Restrictions Precautions Precautions: Back;Fall Precaution Comments: Educated pt on back precautions extensivly with teach back. Required Braces or Orthoses: Spinal Brace Spinal Brace: Lumbar corset;Applied in sitting position Restrictions Weight Bearing Restrictions: No   Pertinent Vitals/Pain 6/10    Mobility  Transfers Overall transfer level: Needs assistance Equipment used: Rolling walker (2 wheeled) Transfers: Sit to/from Stand Sit to Stand: Min assist General transfer comment: VCs for hand placement assist to come to standing from toilet Ambulation/Gait Ambulation/Gait assistance: Min guard Ambulation Distance (Feet): 140 Feet Assistive device: Rolling walker (2 wheeled) Gait Pattern/deviations: Step-to pattern;Decreased stride length;Scissoring;Narrow base of support Gait velocity: decreased Gait velocity interpretation: <1.8 ft/sec, indicative of risk for recurrent falls General Gait Details: patient with imporved balance and activity  tolerance today. Continues to scissor with gait and step on her own feet. w    Exercises     PT Diagnosis:    PT Problem List:   PT Treatment Interventions:     PT Goals (current goals can now be found in the care plan section) Acute Rehab PT Goals Patient Stated Goal: not stated PT Goal Formulation: With patient Time For Goal Achievement: 05/30/13 Potential to Achieve Goals: Good  Visit Information  Last PT Received On: 05/17/13 Assistance Needed: +1 History of Present Illness: 80 y.o. s/p POSTERIOR LUMBAR FUSION 2 LEVEL (N/A) - L3-4 L4-5 Decompressive Laminectomy/posterior lumbar interbody fusion/interbody prosthesis/pedicle screws/posterolateral arthrodesis    Subjective Data  Subjective: I was worn out today Patient Stated Goal: not stated   Cognition  Cognition Arousal/Alertness: Awake/alert Behavior During Therapy: WFL for tasks assessed/performed;Impulsive Overall Cognitive Status: Within Functional Limits for tasks assessed    Balance  Balance Overall balance assessment: Needs assistance Sitting balance-Leahy Scale: Good Sitting balance - Comments: increased pain in sitting Standing balance-Leahy Scale: Good High Level Balance Comments: VCs for compliance with precautions during higher level mobility General Comments General comments (skin integrity, edema, etc.): bathing finished, hygiene perform, transfers from toilet, attempted reeducation on proper use of brace.  Reducated on precautions. Poor compliance despite good recall.  End of Session PT - End of Session Equipment Utilized During Treatment: Gait belt;Back brace Activity Tolerance: Patient limited by pain Patient left: in chair;with call bell/phone within reach;with chair alarm set Nurse Communication: Mobility status;Precautions   GP     Duncan Dull 05/17/2013, 2:49 PM Alben Deeds, [place] DPT  [phone]

## 2013-05-17 NOTE — Progress Notes (Signed)
Patient ID: Donna Cummings, female   DOB: 11/19/1930, 80 y.o.   MRN: [id number] Subjective:  The patient is alert and pleasant. She complains of back soreness.  Objective: Vital signs in last 24 hours: Temp:  [97.1 F (36.2 C)-97.9 F (36.6 C)] 97.8 F (36.6 C) (03/19 1022) Pulse Rate:  [62-76] 70 (03/19 1022) Resp:  [18] 18 (03/19 1022) BP: (104-128)/(50-54) 104/51 mmHg (03/19 1022) SpO2:  [97 %-99 %] 98 % (03/19 1022)  Intake/Output from previous day: 03/18 0701 - 03/19 0700 In: 480 [P.O.:480] Out: 520 [Urine:200; Drains:320] Intake/Output this shift: Total I/O In: 360 [P.O.:360] Out: -   Physical exam the patient is alert and oriented. Her incision is healing well. Her strength is normal.  Lab Results:  Recent Labs  05/15/13 0911  WBC 5.3  HGB 9.1*  HCT 24.5*  PLT 171   BMET  Recent Labs  05/15/13 0542  NA 133*  K 4.1  CL 97  CO2 25  GLUCOSE 91  BUN 8  CREATININE 0.40*  CALCIUM 8.9    Studies/Results: No results found.  Assessment/Plan: Postop day #3: The patient seems to be progressing. We'll plan to send her to [facility] tomorrow.  LOS: 3 days     Rolen Conger D 05/17/2013, 11:51 AM

## 2013-05-17 NOTE — Clinical Social Work Placement (Signed)
Clinical Social Work Department CLINICAL SOCIAL WORK PLACEMENT NOTE 05/17/2013  Patient:  Donna Cummings, Donna Cummings  Account Number:  [id number] Admit date:  05/14/2013  Clinical Social Worker:  Daiva Huge  Date/time:  05/17/2013 02:04 PM  Clinical Social Work is seeking post-discharge placement for this patient at the following level of care:   SKILLED NURSING   (*CSW will update this form in Epic as items are completed)   05/17/2013  Patient/family provided with [place] Department of Clinical Social Work's list of facilities offering this level of care within the geographic area requested by the patient (or if unable, by the patient's family).  05/17/2013  Patient/family informed of their freedom to choose among providers that offer the needed level of care, that participate in Medicare, Medicaid or managed care program needed by the patient, have an available bed and are willing to accept the patient.  05/17/2013  Patient/family informed of [facility]' ownership interest in [facility], as well as of the fact that they are under no obligation to receive care at this facility.  PASARR submitted to EDS on 05/17/2013 PASARR number received from EDS on 05/17/2013  FL2 transmitted to all facilities in geographic area requested by pt/family on  05/17/2013 FL2 transmitted to all facilities within larger geographic area on   Patient informed that his/her managed care company has contracts with or will negotiate with  certain facilities, including the following:     Patient/family informed of bed offers received:   Patient chooses bed at  Physician recommends and patient chooses bed at    Patient to be transferred to  on   Patient to be transferred to facility by   The following physician request were entered in Epic:   Additional [place], MSW, [place]

## 2013-05-17 NOTE — Progress Notes (Signed)
UR complete.  Malin Sambrano RN, MSN 

## 2013-05-18 MED ORDER — BISACODYL 10 MG RE SUPP
10.0000 mg | Freq: Every day | RECTAL | Status: DC | PRN
  Administered 2013-05-18 – 2013-05-21 (×2): 10 mg via RECTAL
  Filled 2013-05-18 (×2): qty 1

## 2013-05-18 MED ORDER — DIAZEPAM 2 MG PO TABS: 2.0000 mg | ORAL_TABLET | Freq: Four times a day (QID) | ORAL | Status: DC | PRN

## 2013-05-18 MED ORDER — DIAZEPAM 2 MG PO TABS
2.0000 mg | ORAL_TABLET | Freq: Four times a day (QID) | ORAL | Status: DC | PRN
Start: 2013-05-18 — End: 2013-05-21

## 2013-05-18 MED ORDER — DSS 100 MG PO CAPS: 100.0000 mg | ORAL_CAPSULE | Freq: Two times a day (BID) | ORAL | Status: DC

## 2013-05-18 MED ORDER — OXYCODONE-ACETAMINOPHEN 5-325 MG PO TABS: 1.0000 | ORAL_TABLET | ORAL | Status: DC | PRN

## 2013-05-18 MED ORDER — FLEET ENEMA 7-19 GM/118ML RE ENEM
1.0000 | ENEMA | Freq: Every day | RECTAL | Status: DC | PRN
  Administered 2013-05-18: 1 via RECTAL
  Filled 2013-05-18: qty 1

## 2013-05-18 NOTE — Discharge Summary (Signed)
Physician Discharge Summary  Patient ID: DEARRA MYHAND MRN: [id number] DOB/AGE: [birth year] 78 y.o.  Admit date: 05/14/2013 Discharge date: 05/18/2013  Admission Diagnoses:L3-4 and L4-5 spondylolisthesis, spinal stenosis, lumbago, lumbar radiculopathy, neurogenic claudication  Discharge Diagnoses: the same Active Problems:   Spondylolisthesis of lumbar region   Discharged Condition: good  Hospital Course: I performed an L3-4 and L4-5 decompression, instrumentation, and fusion on the patient on 05/14/2013. The surgery went well.  The patient's postoperative course was unremarkable. We had PT and OT see the patient and mobilize the patient. Arrangements were made for her to go to [facility] on 05/18/2013. The patient was given oral and written discharge instructions. All her questions were answered.  Consults:PT OT Significant Diagnostic Studies:none Treatments:L3-4 and L4-5 decompression, instrumentation, and fusion. Discharge Exam: Blood pressure 109/53, pulse 67, temperature 98.1 F (36.7 C), temperature source Oral, resp. rate 18, height 5\' 4"  (1.626 m), weight 42.638 kg (94 lb), SpO2 98.00%. The patient is alert and pleasant. Her strength is normal. Her wound is healing well.  Disposition: [facility]  Discharge Orders   Future Orders Complete By Expires   Call MD for:  difficulty breathing, headache or visual disturbances  As directed    Call MD for:  extreme fatigue  As directed    Call MD for:  hives  As directed    Call MD for:  persistant dizziness or light-headedness  As directed    Call MD for:  persistant nausea and vomiting  As directed    Call MD for:  redness, tenderness, or signs of infection (pain, swelling, redness, odor or green/yellow discharge around incision site)  As directed    Call MD for:  severe uncontrolled pain  As directed    Call MD for:  temperature >100.4  As directed    Diet - low sodium heart healthy  As  directed    Discharge instructions  As directed    Comments:     Call [phone] for a followup appointment. Take a stool softener while you are using pain medications.   Driving Restrictions  As directed    Comments:     Do not drive for 2 weeks.   Increase activity slowly  As directed    Lifting restrictions  As directed    Comments:     Do not lift more than 5 pounds. No excessive bending or twisting.   May shower / Bathe  As directed    Comments:     He may shower after the pain she is removed 3 days after surgery. Leave the incision alone.   No dressing needed  As directed    Remove dressing in 48 hours  As directed    Comments:     Your stitches are under the scan and will dissolve by themselves. The Steri-Strips will fall off after you take a few showers. Do not rub back or pick at the wound, Leave the wound alone.       Medication List    STOP taking these medications       acetaminophen 500 MG tablet  Commonly known as:  TYLENOL      TAKE these medications       aspirin 81 MG tablet  Take 81 mg by mouth daily.     cholecalciferol 1000 UNITS tablet  Commonly known as:  VITAMIN D  Take 2,000 Units by mouth daily.     diazepam 2 MG tablet  Commonly known as:  VALIUM  Take 1 tablet (2 mg total) by mouth every 6 (six) hours as needed for muscle spasms.     docusate sodium 100 MG capsule  Commonly known as:  COLACE  Take 200 mg by mouth at bedtime.     DSS 100 MG Caps  Take 100 mg by mouth 2 (two) times daily.     escitalopram 10 MG tablet  Commonly known as:  LEXAPRO  Take 5 mg by mouth daily.     fish oil-omega-3 fatty acids 1000 MG capsule  Take 1 g by mouth daily.     magnesium hydroxide 400 MG/5ML suspension  Commonly known as:  MILK OF MAGNESIA  Take 30 mLs by mouth daily as needed for mild constipation.     methylcellulose 1 % ophthalmic solution  Commonly known as:  ARTIFICIAL TEARS  Place 1 drop into both eyes as needed. Dry eyes.      multivitamin with minerals Tabs tablet  Take 1 tablet by mouth daily.     NARCOSOFT HERBAL LAX PO  Take 1-2 tablets by mouth at bedtime as needed. constipation     oxyCODONE-acetaminophen 5-325 MG per tablet  Commonly known as:  PERCOCET/ROXICET  Take 1-2 tablets by mouth every 4 (four) hours as needed for moderate pain.     PHILLIPS COLON HEALTH PO  Take 1 capsule by mouth daily.         SignedNewman Pies D 05/18/2013, 8:01 AM

## 2013-05-18 NOTE — Social Work (Signed)
SNF bed confirmed for patient at [facility]- her preferred setting for rehab- d/c has been put on hold per RN until tomorrow- I have updated SNF and I will coordinate transfer tomorrow a.m. Patient pleased with SNF option- reports having pain and constipation today- RN addressing these concerns with MD. Eduard Clos, MSW, [place]

## 2013-05-18 NOTE — Progress Notes (Signed)
Physical Therapy Treatment Patient Details Name: Donna Cummings MRN: [id number] DOB: 1930-06-27 Today's Date: 05/18/2013 Time: 0950-1014 PT Time Calculation (min): 24 min  PT Assessment / Plan / Recommendation  History of Present Illness 80 y.o. s/p POSTERIOR LUMBAR FUSION 2 LEVEL (N/A) - L3-4 L4-5 Decompressive Laminectomy/posterior lumbar interbody fusion/interbody prosthesis/pedicle screws/posterolateral arthrodesis   PT Comments   Patient progressing with ambulation, still requires cues and assist for transfers and safety.    Follow Up Recommendations  SNF     Does the patient have the potential to tolerate intense rehabilitation     Barriers to Discharge        Equipment Recommendations  Rolling walker with 5" wheels;3in1 (PT)    Recommendations for Other Services    Frequency Min 5X/week   Progress towards PT Goals Progress towards PT goals: Progressing toward goals  Plan Current plan remains appropriate    Precautions / Restrictions Precautions Precautions: Back;Fall Precaution Comments: Educated pt on back precautions extensivly with teach back. Required Braces or Orthoses: Spinal Brace Spinal Brace: Lumbar corset;Applied in sitting position Restrictions Weight Bearing Restrictions: No   Pertinent Vitals/Pain 6/10    Mobility  Transfers Overall transfer level: Needs assistance Equipment used: Rolling walker (2 wheeled) Transfers: Sit to/from [facility] Sit to Stand: Min assist Stand pivot transfers: Min assist General transfer comment: VCs for hand placement assist to come to standing from toilet Ambulation/Gait Ambulation/Gait assistance: Supervision;Min guard Ambulation Distance (Feet): 360 Feet (one standing rest break) Assistive device: Rolling walker (2 wheeled) Gait Pattern/deviations: Step-to pattern;Decreased stride length;Scissoring;Narrow base of support Gait velocity: decreased Gait velocity interpretation: <1.8 ft/sec,  indicative of risk for recurrent falls General Gait Details: continued cues for increased cadence and stride.  One standing rest break to help patient with relaxation as she was significantly tensing up as ambulation progressed.    Exercises     PT Diagnosis:    PT Problem List:   PT Treatment Interventions:     PT Goals (current goals can now be found in the care plan section) Acute Rehab PT Goals Patient Stated Goal: not stated PT Goal Formulation: With patient Time For Goal Achievement: 05/30/13 Potential to Achieve Goals: Good  Visit Information  Last PT Received On: 05/18/13 Assistance Needed: +1 History of Present Illness: 80 y.o. s/p POSTERIOR LUMBAR FUSION 2 LEVEL (N/A) - L3-4 L4-5 Decompressive Laminectomy/posterior lumbar interbody fusion/interbody prosthesis/pedicle screws/posterolateral arthrodesis    Subjective Data  Subjective: I was worn out today Patient Stated Goal: not stated   Cognition  Cognition Arousal/Alertness: Awake/alert Behavior During Therapy: WFL for tasks assessed/performed;Impulsive Overall Cognitive Status: Within Functional Limits for tasks assessed    Balance  Balance Overall balance assessment: Needs assistance Sitting balance-Leahy Scale: Good Sitting balance - Comments: continues to have increased pain in sitting Standing balance support: During functional activity Standing balance-Leahy Scale: Good High level balance activites: Turns High Level Balance Comments: VCs for compliance with precautions during higher level mobility (patient with very wide turns, cues for technique with RW) General Comments General comments (skin integrity, edema, etc.): hygiene performed from commode, patient education on how to don brace, poor carryover from prior sessions. Patient verbalizes precautions but continues to require cues for compliance.  End of Session PT - End of Session Equipment Utilized During Treatment: Gait belt;Back brace Activity  Tolerance: Patient limited by pain Patient left: in chair;with call bell/phone within reach;with chair alarm set Nurse Communication: Mobility status;Precautions   GP     Duncan Dull 05/18/2013,  10:21 AM Alben Deeds, PT DPT  [phone]

## 2013-05-19 LAB — BASIC METABOLIC PANEL
BUN: 7 mg/dL (ref 6–23)
CHLORIDE: 94 meq/L — AB (ref 96–112)
CO2: 26 meq/L (ref 19–32)
Calcium: 8.7 mg/dL (ref 8.4–10.5)
Creatinine, Ser: 0.44 mg/dL — ABNORMAL LOW (ref 0.50–1.10)
GFR calc Af Amer: >90 mL/min (ref >90)
GFR calc non Af Amer: >90 mL/min (ref >90)
GLUCOSE: 129 mg/dL — AB (ref 70–99)
POTASSIUM: 4 meq/L (ref 3.7–5.3)
SODIUM: 130 meq/L — AB (ref 137–147)

## 2013-05-19 LAB — CBC
HCT: 24.5 % — ABNORMAL LOW (ref 36.0–46.0)
HEMOGLOBIN: 8.9 g/dL — AB (ref 12.0–15.0)
MCH: 36.2 pg — ABNORMAL HIGH (ref 26.0–34.0)
MCHC: 36.3 g/dL — ABNORMAL HIGH (ref 30.0–36.0)
MCV: 99.6 fL (ref 78.0–100.0)
PLATELETS: 245 10*3/uL (ref 150–400)
RBC: 2.46 MIL/uL — AB (ref 3.87–5.11)
RDW: 12.8 % (ref 11.5–15.5)
WBC: 4.8 10*3/uL (ref 4.0–10.5)

## 2013-05-19 MED ORDER — SODIUM CHLORIDE 0.9 % IV SOLN
INTRAVENOUS | Status: DC
  Administered 2013-05-19: 15:00:00 via INTRAVENOUS

## 2013-05-19 NOTE — Progress Notes (Signed)
Subjective: Patient reports Patient reports feeling weak this morning and has been noted to be hypotensive with blood pressure in the 80s.  Objective: Vital signs in last 24 hours: Temp:  [97.9 F (36.6 C)-98.5 F (36.9 C)] 98 F (36.7 C) (03/21 0933) Pulse Rate:  [67-85] 82 (03/21 0933) Resp:  [18] 18 (03/21 0933) BP: (88-124)/(38-56) 88/52 mmHg (03/21 0933) SpO2:  [97 %-100 %] 98 % (03/21 0933)  Intake/Output from previous day: 03/20 0701 - 03/21 0700 In: -  Out: 3 [Urine:1; Stool:2] Intake/Output this shift: Total I/O In: 600 [P.O.:600] Out: -   Incision appears clean and dry and motor function appears good. Lungs are clear to auscultation the heart has a regular rate and rhythm. Pulse is a bit thready.  Lab Results: No results found for this basename: WBC, HGB, HCT, PLT,  in the last 72 hours BMET No results found for this basename: NA, K, CL, CO2, GLUCOSE, BUN, CREATININE, CALCIUM,  in the last 72 hours  Studies/Results: No results found.  Assessment/Plan: We'll hold off on discharge today. IV will be restarted and will check a CBC and a be met.  LOS: 5 days  Cancel discharge for today   Donna Cummings 05/19/2013, 9:56 AM

## 2013-05-19 NOTE — Progress Notes (Signed)
Physical Therapy Treatment Patient Details Name: Donna Cummings MRN: [id number] DOB: 22-Dec-1930 Today's Date: 05/19/2013 Time: 6269-4854 PT Time Calculation (min): 23 min  PT Assessment / Plan / Recommendation  History of Present Illness 80 y.o. s/p POSTERIOR LUMBAR FUSION 2 LEVEL (N/A) - L3-4 L4-5 Decompressive Laminectomy/posterior lumbar interbody fusion/interbody prosthesis/pedicle screws/posterolateral arthrodesis   PT Comments   Pt reports not feeling as well today and RN reported low BP.  BP has increased and able to work with therapy however pt limited due to overall fatigue.     Follow Up Recommendations  SNF     Equipment Recommendations  Rolling walker with 5" wheels;3in1 (PT)    Frequency Min 5X/week   Progress towards PT Goals Progress towards PT goals: Progressing toward goals  Plan Current plan remains appropriate    Precautions / Restrictions Precautions Precautions: Back;Fall Precaution Comments: Educated pt on back precautions extensivly with teach back. Required Braces or Orthoses: Spinal Brace Spinal Brace: Lumbar corset;Applied in sitting position   Pertinent Vitals/Pain 4/10 back pain; sitting 94/51; standing 97/48    Mobility  Transfers Overall transfer level: Needs assistance Equipment used: Rolling walker (2 wheeled) Transfers: Sit to/from Stand Sit to Stand: Min guard General transfer comment: VCs for hand placement assist to come to standing from toilet Ambulation/Gait Ambulation/Gait assistance: Min assist Ambulation Distance (Feet): 60 Feet Assistive device: Rolling walker (2 wheeled) Gait Pattern/deviations: Step-through pattern;Decreased stance time - right;Shuffle;Antalgic Gait velocity: decreased    Exercises     PT Diagnosis:    PT Problem List:   PT Treatment Interventions:     PT Goals (current goals can now be found in the care plan section) Acute Rehab PT Goals Patient Stated Goal: To get better PT Goal Formulation: With  patient Time For Goal Achievement: 05/30/13 Potential to Achieve Goals: Good  Visit Information  Last PT Received On: 05/19/13 Assistance Needed: +1 History of Present Illness: 80 y.o. s/p POSTERIOR LUMBAR FUSION 2 LEVEL (N/A) - L3-4 L4-5 Decompressive Laminectomy/posterior lumbar interbody fusion/interbody prosthesis/pedicle screws/posterolateral arthrodesis    Subjective Data  Subjective: "I'm not having a good day." Patient Stated Goal: To get better   Cognition  Cognition Arousal/Alertness: Awake/alert Behavior During Therapy: WFL for tasks assessed/performed;Impulsive Overall Cognitive Status: Within Functional Limits for tasks assessed    End of Session PT - End of Session Equipment Utilized During Treatment: Gait belt;Back brace Activity Tolerance: Patient limited by pain Patient left: in chair;with call bell/phone within reach;with chair alarm set Nurse Communication: Mobility status;Precautions   GP     Donna Cummings 05/19/2013, 2:59 PM  Antoine Poche, [place] DPT [phone]

## 2013-05-19 NOTE — Progress Notes (Signed)
Paged Dr. Ronnald Ramp to let him know lab results; [phone] received call back; new orders received. Pt. Notified of new fluid restriction order. Will monitor.

## 2013-05-19 NOTE — Progress Notes (Addendum)
Pt. With c/o feeling "light-headed and nauseated" while OOB. BP 88/52 manual; pulse 80s sats 98% RA, afebrile. Dr. Ellene Route on unit and notified, new orders received.

## 2013-05-20 LAB — BASIC METABOLIC PANEL
BUN: 8 mg/dL (ref 6–23)
CHLORIDE: 94 meq/L — AB (ref 96–112)
CO2: 25 mEq/L (ref 19–32)
Calcium: 8.9 mg/dL (ref 8.4–10.5)
Creatinine, Ser: 0.38 mg/dL — ABNORMAL LOW (ref 0.50–1.10)
GFR calc Af Amer: >90 mL/min (ref >90)
GLUCOSE: 100 mg/dL — AB (ref 70–99)
Potassium: 4 mEq/L (ref 3.7–5.3)
SODIUM: 132 meq/L — AB (ref 137–147)

## 2013-05-20 NOTE — Progress Notes (Signed)
Patient ID: Donna Cummings, female   DOB: 23-Sep-1930, 80 y.o.   MRN: [id number] Patient looks much better today. She feels much better. She still has back soreness but this is to be expected. SHe is scheduled for discharge tomorrow. We'll repeat her sodium level today. Continue fluid restriction for now.

## 2013-05-21 ENCOUNTER — Encounter: Payer: Self-pay | Admitting: *Deleted

## 2013-05-21 DIAGNOSIS — M549 Dorsalgia, unspecified: Secondary | ICD-10-CM | POA: Diagnosis not present

## 2013-05-21 DIAGNOSIS — F411 Generalized anxiety disorder: Secondary | ICD-10-CM | POA: Diagnosis not present

## 2013-05-21 DIAGNOSIS — IMO0002 Reserved for concepts with insufficient information to code with codable children: Secondary | ICD-10-CM | POA: Diagnosis not present

## 2013-05-21 DIAGNOSIS — F3289 Other specified depressive episodes: Secondary | ICD-10-CM | POA: Diagnosis not present

## 2013-05-21 DIAGNOSIS — R279 Unspecified lack of coordination: Secondary | ICD-10-CM | POA: Diagnosis not present

## 2013-05-21 DIAGNOSIS — M545 Low back pain, unspecified: Secondary | ICD-10-CM | POA: Diagnosis not present

## 2013-05-21 DIAGNOSIS — K5900 Constipation, unspecified: Secondary | ICD-10-CM | POA: Diagnosis not present

## 2013-05-21 DIAGNOSIS — Z4789 Encounter for other orthopedic aftercare: Secondary | ICD-10-CM | POA: Diagnosis not present

## 2013-05-21 DIAGNOSIS — K589 Irritable bowel syndrome without diarrhea: Secondary | ICD-10-CM | POA: Diagnosis not present

## 2013-05-21 DIAGNOSIS — F329 Major depressive disorder, single episode, unspecified: Secondary | ICD-10-CM | POA: Diagnosis not present

## 2013-05-21 DIAGNOSIS — M4310 Spondylolisthesis, site unspecified: Secondary | ICD-10-CM | POA: Diagnosis not present

## 2013-05-21 DIAGNOSIS — M48062 Spinal stenosis, lumbar region with neurogenic claudication: Secondary | ICD-10-CM | POA: Diagnosis not present

## 2013-05-21 DIAGNOSIS — M6281 Muscle weakness (generalized): Secondary | ICD-10-CM | POA: Diagnosis not present

## 2013-05-21 DIAGNOSIS — G4700 Insomnia, unspecified: Secondary | ICD-10-CM | POA: Diagnosis not present

## 2013-05-21 DIAGNOSIS — R262 Difficulty in walking, not elsewhere classified: Secondary | ICD-10-CM | POA: Diagnosis not present

## 2013-05-21 NOTE — Progress Notes (Signed)
   CSW received information from CM that Pt is cleared to d/c to [facility] today.   CSW left a message with admissions coordinator informing facility that the Pt will be d/c'ing to [facility] today.   CSW will continue to follow Pt for d/c planning.    [facility]  4N 1-16;  [phone] Phone: [phone]

## 2013-05-21 NOTE — Progress Notes (Signed)
Subjective:  The patient is alert and pleasant. She looks well. She is in no apparent distress. She has no complaints. She is ready to go to [facility] rehabilitation.  Objective: Vital signs in last 24 hours: Temp:  [97.4 F (36.3 C)-99 F (37.2 C)] 97.4 F (36.3 C) (03/23 1000) Pulse Rate:  [59-71] 71 (03/23 1000) Resp:  [16-18] 18 (03/23 1000) BP: (106-128)/(48-68) 128/65 mmHg (03/23 1000) SpO2:  [99 %-100 %] 100 % (03/23 1000)  Intake/Output from previous day:   Intake/Output this shift: Total I/O In: 300 [P.O.:300] Out: -   Physical exam the patient is alert and oriented x3. She is moving all 4 extremities well.   Lab Results:  Recent Labs  05/19/13 1345  WBC 4.8  HGB 8.9*  HCT 24.5*  PLT 245   BMET  Recent Labs  05/19/13 1345 05/20/13 1400  NA 130* 132*  K 4.0 4.0  CL 94* 94*  CO2 26 25  GLUCOSE 129* 100*  BUN 7 8  CREATININE 0.44* 0.38*  CALCIUM 8.7 8.9    Studies/Results: No results found.  Assessment/Plan: Postop day #7: The patient is ready for transfer to [facility] rehabilitation. Her sodium is 132. Her hypertension has been asymptomaticand has resolved. I've answered all the patient's questions.  LOS: 7 days     Ilyse Tremain D 05/21/2013, 10:07 AM

## 2013-05-21 NOTE — Progress Notes (Signed)
Physical Therapy Treatment Patient Details Name: Donna Cummings MRN: [id number] DOB: December 07, 1930 Today's Date: 05/21/2013 Time: 5176-1607 PT Time Calculation (min): 25 min  PT Assessment / Plan / Recommendation  History of Present Illness 80 y.o. s/p POSTERIOR LUMBAR FUSION 2 LEVEL (N/A) - L3-4 L4-5 Decompressive Laminectomy/posterior lumbar interbody fusion/interbody prosthesis/pedicle screws/posterolateral arthrodesis   PT Comments   Patient progressing with ambulation. Increased distance today. Still demonstrates poor compliance with precautions despite ability to verbalize 3/3 precautions.  Follow Up Recommendations  SNF     Does the patient have the potential to tolerate intense rehabilitation     Barriers to Discharge        Equipment Recommendations  Rolling walker with 5" wheels;3in1 (PT)    Recommendations for Other Services    Frequency Min 5X/week   Progress towards PT Goals Progress towards PT goals: Progressing toward goals  Plan Current plan remains appropriate    Precautions / Restrictions Precautions Precautions: Back;Fall Precaution Comments: Educated pt on back precautions extensivly with teach back. Required Braces or Orthoses: Spinal Brace Spinal Brace: Lumbar corset;Applied in sitting position Restrictions Weight Bearing Restrictions: No   Pertinent Vitals/Pain VSS, no pain reported at this time, BPs all 3 positions 130s over 60s    Mobility  Bed Mobility Overal bed mobility: Needs Assistance Bed Mobility: Rolling;Sidelying to Sit Rolling: Supervision Sidelying to sit: Min guard General bed mobility comments: Good technique today Transfers Overall transfer level: Needs assistance Equipment used: Rolling walker (2 wheeled) Transfers: Sit to/from Stand Sit to Stand: Min guard General transfer comment: Continues to required VCs for hand placement and safety Ambulation/Gait Ambulation/Gait assistance: Supervision Ambulation Distance (Feet): 420  Feet (standing rest break, VC for good cadence and normalized gait) Assistive device: Rolling walker (2 wheeled) Gait Pattern/deviations: Step-through pattern;Decreased stance time - right;Shuffle;Antalgic Gait velocity: decreased (improves with cues) Gait velocity interpretation: <1.8 ft/sec, indicative of risk for recurrent falls General Gait Details: Standing rest break, continued cues for relaxation during ambulation as well as increased cadence      PT Goals (current goals can now be found in the care plan section) Acute Rehab PT Goals Patient Stated Goal: To get better PT Goal Formulation: With patient Time For Goal Achievement: 05/30/13 Potential to Achieve Goals: Good  Visit Information  Last PT Received On: 05/21/13 Assistance Needed: +1 History of Present Illness: 80 y.o. s/p POSTERIOR LUMBAR FUSION 2 LEVEL (N/A) - L3-4 L4-5 Decompressive Laminectomy/posterior lumbar interbody fusion/interbody prosthesis/pedicle screws/posterolateral arthrodesis    Subjective Data  Subjective: "I had another spell this morning" Patient Stated Goal: To get better   Cognition  Cognition Arousal/Alertness: Awake/alert Behavior During Therapy: WFL for tasks assessed/performed;Impulsive Overall Cognitive Status: Within Functional Limits for tasks assessed    Balance  Balance High level balance activites: Side stepping;Direction changes;Turns;Head turns High Level Balance Comments: continued cues for compliance with precautions during ambulation. Performed multiple turns with demonstrates of technique for turning without twisting General Comments General comments (skin integrity, edema, etc.): Orthostatic vitals assessed at the beginning of session secondary to patient report of having a "spell" where she felt increased fatigue and weakness this am.  BPs all WNL negative orthostatics.  Educated patient on fatigue in am and importance of early mobility. Patient did report she felt much better  and well energized after ambulating this session.  End of Session PT - End of Session Equipment Utilized During Treatment: Gait belt;Back brace Activity Tolerance: Patient limited by pain Patient left: in chair;with call bell/phone within reach;with chair alarm  set Nurse Communication: Mobility status;Precautions   GP     Duncan Dull 05/21/2013, 9:51 AM Alben Deeds, PT DPT  [phone]

## 2013-05-21 NOTE — Discharge Summary (Signed)
  This is an addendum to the patient's prior dictated discharge summary. The patient had some asymptomatic hypotension. Her hemoglobin was stable. She was also found to have asymptomatic hyponatremia. This was observed. Her last sodium was 132. She is ready for transferred to [facility] rehabilitation. She was instructed to followup with her primary doctor, Dr. Crist Infante, a checkup on her sodium in the next couple weeks.

## 2013-05-21 NOTE — Progress Notes (Signed)
Pt to be d/c today to [facility].   Pt and family agreeable. Confirmed plans with facility.  Plan transfer via EMS.    Montrelle Eddings LCSWA  [facility]  4N 1-16;  6N1-16 Phone: [phone]    

## 2013-05-21 NOTE — Progress Notes (Addendum)
Occupational Therapy Treatment Patient Details Name: Donna Cummings MRN: [id number] DOB: November 09, 1930 Today's Date: 05/21/2013 Time: 6160-7371 OT Time Calculation (min): 31 min  OT Assessment / Plan / Recommendation  History of present illness 80 y.o. s/p POSTERIOR LUMBAR FUSION 2 LEVEL (N/A) - L3-4 L4-5 Decompressive Laminectomy/posterior lumbar interbody fusion/interbody prosthesis/pedicle screws/posterolateral arthrodesis   OT comments  Pt moving well during session. Education provided during session, and pt practiced with AE for LB ADLs.   Follow Up Recommendations  SNF    Barriers to Discharge       Equipment Recommendations  Other (comment) (defer to next venue)    Recommendations for Other Services    Frequency Min 2X/week   Progress towards OT Goals Progress towards OT goals: Progressing toward goals  Plan Discharge plan remains appropriate    Precautions / Restrictions Precautions Precautions: Back;Fall Precaution Comments: Pt able to state 3/3 back precautions. Required Braces or Orthoses: Spinal Brace Spinal Brace: Lumbar corset;Applied in sitting position Restrictions Weight Bearing Restrictions: No   Pertinent Vitals/Pain 7/10. Nurse notified.     ADL  Grooming: Teeth care;Wash/dry face;Wash/dry hands;Brushing hair;Set up;Supervision/safety (tactile cues) Where Assessed - Grooming: Supported standing Upper Body Dressing: Supervision/safety (back brace) Where Assessed - Upper Body Dressing: Unsupported sitting Lower Body Dressing: Minimal assistance Where Assessed - Lower Body Dressing: Supported sit to stand Toilet Transfer: Magazine features editor Method: Sit to Loss adjuster, chartered: Raised toilet seat with arms (or 3-in-1 over toilet) Toileting - Clothing Manipulation and Hygiene: Minimal assistance (helped tuck underwear under brace) Where Assessed - Toileting Clothing Manipulation and Hygiene: Sit to stand from 3-in-1 or toilet;Sit on 3-in-1  or toilet Equipment Used: Gait belt;Back brace;Reacher;Long-handled sponge;Long-handled shoe horn;Rolling walker;Sock aid Transfers/Ambulation Related to ADLs: Min guard ADL Comments: Discussed use of bag on walker and safe shoewear. Practiced with AE for LB ADLs. Spoke about toilet aide if pt may need it. Educated that pt could use elastic shoe laces. Educated on use of cup for teeth care and placement of grooming items to avoid breaking precautions.  Explained/demonstrated different positions to perform LB dressing. Provided education on safety with ADLs.    OT Diagnosis:    OT Problem List:   OT Treatment Interventions:     OT Goals(current goals can now be found in the care plan section) Acute Rehab OT Goals Patient Stated Goal: not stated OT Goal Formulation: With patient Time For Goal Achievement: 05/22/13 Potential to Achieve Goals: Good ADL Goals Pt Will Perform Grooming: with supervision;standing Pt Will Perform Upper Body Bathing: with supervision;sitting (including gathering supplies) Pt Will Perform Lower Body Bathing: with supervision;sit to/from stand;with adaptive equipment Pt Will Perform Lower Body Dressing: with supervision;sit to/from stand Pt Will Transfer to Toilet: with supervision;ambulating Pt Will Perform Toileting - Clothing Manipulation and hygiene: with supervision;sit to/from stand Additional ADL Goal #1: Pt will independently verbalize and demonstrate 3/3 back precautions.    Visit Information  Last OT Received On: 05/21/13 Assistance Needed: +1 History of Present Illness: 80 y.o. s/p POSTERIOR LUMBAR FUSION 2 LEVEL (N/A) - L3-4 L4-5 Decompressive Laminectomy/posterior lumbar interbody fusion/interbody prosthesis/pedicle screws/posterolateral arthrodesis    Subjective Data      Prior Functioning       Cognition  Cognition Arousal/Alertness: Awake/alert Behavior During Therapy: WFL for tasks assessed/performed;Impulsive Overall Cognitive Status:  Within Functional Limits for tasks assessed    Mobility  Bed Mobility Overal bed mobility: Needs Assistance Bed Mobility: Rolling;Sit to Sidelying;Sidelying to Sit Rolling: Min assist;Supervision Sidelying to sit:  Supervision Sit to sidelying: Supervision General bed mobility comments: Assist to keep body in alignment with rolling.  Transfers Overall transfer level: Needs assistance Equipment used: Rolling walker (2 wheeled) Transfers: Sit to/from Stand Sit to Stand: Min guard General transfer comment: Cues for technique.    Exercises         End of Session OT - End of Session Equipment Utilized During Treatment: Gait belt;Rolling walker;Back brace Activity Tolerance: Patient tolerated treatment well Patient left: in chair;with call bell/phone within reach;with chair alarm set Nurse Communication: Other (comment);Mobility status (pain )  GO     Benito Mccreedy OTR/L [phone] 05/21/2013, 11:14 AM

## 2013-05-25 ENCOUNTER — Non-Acute Institutional Stay (SKILLED_NURSING_FACILITY): Payer: Medicare Other | Admitting: Internal Medicine

## 2013-05-25 ENCOUNTER — Other Ambulatory Visit: Payer: Self-pay | Admitting: *Deleted

## 2013-05-25 ENCOUNTER — Encounter: Payer: Self-pay | Admitting: Internal Medicine

## 2013-05-25 DIAGNOSIS — F32A Depression, unspecified: Secondary | ICD-10-CM | POA: Insufficient documentation

## 2013-05-25 DIAGNOSIS — F329 Major depressive disorder, single episode, unspecified: Secondary | ICD-10-CM | POA: Insufficient documentation

## 2013-05-25 DIAGNOSIS — M4310 Spondylolisthesis, site unspecified: Secondary | ICD-10-CM

## 2013-05-25 DIAGNOSIS — F3289 Other specified depressive episodes: Secondary | ICD-10-CM

## 2013-05-25 DIAGNOSIS — M4316 Spondylolisthesis, lumbar region: Secondary | ICD-10-CM

## 2013-05-25 DIAGNOSIS — K589 Irritable bowel syndrome without diarrhea: Secondary | ICD-10-CM | POA: Diagnosis not present

## 2013-05-25 MED ORDER — ALPRAZOLAM 0.25 MG PO TABS: ORAL_TABLET | ORAL | Status: DC

## 2013-05-25 NOTE — Assessment & Plan Note (Signed)
Continue probiotics

## 2013-05-25 NOTE — Telephone Encounter (Signed)
rx faxed to [facility] @ [phone]. 

## 2013-05-25 NOTE — Assessment & Plan Note (Signed)
S/p L3-4 and L4-5 decompression, instrumentation and fusion on 3/16.; pt on pain meds, valium for muscle spasms and ASA 81 mg daily for prophylaxis

## 2013-05-25 NOTE — Progress Notes (Signed)
MRN: [id number] Name: Donna Cummings  Sex: female Age: 80 y.o. DOB: 1930-06-19  [place] #: [facility] Facility/Room: 902 Level Of Care: SNF Provider: Inocencio Homes D Emergency Contacts: Extended Emergency Contact Information Primary Emergency Contact: Atkins,Trudy Address: [address]          [place], [place] [address] Johnnette Litter of [place] Phone: [phone] Work Phone: [phone] Relation: Sister Secondary Emergency Contact: D'Auvray,Val  [employer]: [phone] Relation: Son  Code Status: FULL  Allergies: Penicillins  Chief Complaint  Patient presents with  . nursing home admision    HPI: Patient is 80 y.o. female who had lumbar surgery and who is admitted for OT/PT.  Past Medical History  Diagnosis Date  . Colon polyps     Adenomatus and Hyperplastic  . Internal hemorrhoids   . Gastritis   . Arthritis   . TIA (transient ischemic attack)   . Irritable bowel   . Reflux esophagitis 2011  . Gastric ulcer     multiple linear ulcers in body of stomach  . Gastric polyps   . Antritis (stomach) 2003    erosive  . Allergy   . Blood transfusion   . Cancer     skin  . GERD (gastroesophageal reflux disease)   . Stroke   . Depression     Past Surgical History  Procedure Laterality Date  . Colonoscopy  '88,'89,'93,'98'05    Dierdre Searles MD, [facility]  . Esophagogastroduodenoscopy  '02,'03,'05    Dierdre Searles MD, [facility]  . Appendectomy    . Total hip arthroplasty  '00    bilateral      Medication List       This list is accurate as of: 05/25/13  7:30 PM.  Always use your most recent med list.               ALPRAZolam 0.25 MG tablet  Commonly known as:  XANAX  Take 1 tablet by mouth twice a day as needed for Anxiety     aspirin 81 MG tablet  Take 81 mg by mouth daily.     cholecalciferol 1000 UNITS tablet  Commonly known as:  VITAMIN D  Take 2,000 Units by mouth daily.     diazepam 2  MG tablet  Commonly known as:  VALIUM  Take 1 tablet (2 mg total) by mouth every 6 (six) hours as needed for muscle spasms.     DSS 100 MG Caps  Take 100 mg by mouth 2 (two) times daily. Take 200 mg at bedtime     escitalopram 10 MG tablet  Commonly known as:  LEXAPRO  Take 5 mg by mouth daily.     fish oil-omega-3 fatty acids 1000 MG capsule  Take 1 g by mouth daily.     magnesium hydroxide 400 MG/5ML suspension  Commonly known as:  MILK OF MAGNESIA  Take 30 mLs by mouth daily as needed for mild constipation.     methylcellulose 1 % ophthalmic solution  Commonly known as:  ARTIFICIAL TEARS  Place 1 drop into both eyes as needed. Dry eyes.     multivitamin with minerals Tabs tablet  Take 1 tablet by mouth daily.     NARCOSOFT HERBAL LAX PO  Take 1-2 tablets by mouth at bedtime as needed. constipation     oxyCODONE-acetaminophen 5-325 MG per tablet  Commonly known as:  PERCOCET/ROXICET  Take 1-2 tablets by mouth every 4 (four) hours as needed  for moderate pain.     PHILLIPS COLON HEALTH PO  Take 1 capsule by mouth daily.     zolpidem 10 MG tablet  Commonly known as:  AMBIEN        No orders of the defined types were placed in this encounter.     There is no immunization history on file for this patient.  History  Substance Use Topics  . Smoking status: Former Research scientist (life sciences)  . Smokeless tobacco: Never Used  . Alcohol Use: 8.4 oz/week    14 Glasses of wine per week     Comment: 2 glasses a day, occ martini or scotch    Family history is noncontributory    Review of Systems  DATA OBTAINED: from patient; pt has no c/o GENERAL: Feels well no fevers, fatigue, appetite changes SKIN: No itching, rash  EYES: No eye pain, redness, discharge EARS: No earache, tinnitus, change in hearing NOSE: No congestion, drainage or bleeding  MOUTH/THROAT: No mouth or tooth pain RESPIRATORY: No cough, wheezing, SOB CARDIAC: No chest pain, palpitations, lower extremity edema  GI:  No abdominal pain, No N/V/D or constipation, No heartburn or reflux  GU: No dysuria, frequency or urgency, or incontinence  MUSCULOSKELETAL: No unrelieved bone/joint pain NEUROLOGIC: No headache, dizziness or focal weakness PSYCHIATRIC: No overt anxiety or sadness. Sleeps well. No behavior issue.   Filed Vitals:   05/25/13 1906  BP: 125/62  Pulse: 71  Temp: 98.4 F (36.9 C)  Resp: 17    Physical Exam  GENERAL APPEARANCE: Alert, conversant. Appropriately groomed. No acute distress.  SKIN: No diaphoresis rash HEAD: Normocephalic, atraumatic  EYES: Conjunctiva/lids clear. Pupils round, reactive. EOMs intact.  EARS: External exam WNL, canals clear. Hearing grossly normal.  NOSE: No deformity or discharge.  MOUTH/THROAT: Lips w/o lesions RESPIRATORY: Breathing is even, unlabored. Lung sounds are clear   CARDIOVASCULAR: Heart RRR no murmurs, rubs or gallops. No peripheral edema.  GASTROINTESTINAL: Abdomen is soft, non-tender, not distended w/ normal bowel sounds GENITOURINARY: Bladder non tender, not distended  MUSCULOSKELETAL: No abnormal joints or musculature NEUROLOGIC: Oriented X3. Cranial nerves 2-12 grossly intact. Moves all extremities no tremor. PSYCHIATRIC: Mood and affect appropriate to situation, no behavioral issues  Patient Active Problem List   Diagnosis Date Noted  . Depression   . Spondylolisthesis of lumbar region 05/14/2013  . Personal history of colonic polyps-adenomas 07/08/2011  . IBS (irritable bowel syndrome) 06/11/2011    CBC    Component Value Date/Time   WBC 4.8 05/19/2013 1345   RBC 2.46* 05/19/2013 1345   HGB 8.9* 05/19/2013 1345   HCT 24.5* 05/19/2013 1345   PLT 245 05/19/2013 1345   MCV 99.6 05/19/2013 1345   LYMPHSABS 1.3 03/19/2008 1920   MONOABS 0.4 03/19/2008 1920   EOSABS 0.1 03/19/2008 1920   BASOSABS 0.0 03/19/2008 1920    CMP     Component Value Date/Time   NA 132* 05/20/2013 1400   K 4.0 05/20/2013 1400   CL 94* 05/20/2013 1400   CO2 25  05/20/2013 1400   GLUCOSE 100* 05/20/2013 1400   BUN 8 05/20/2013 1400   CREATININE 0.38* 05/20/2013 1400   CALCIUM 8.9 05/20/2013 1400   PROT 6.4 03/19/2008 1920   ALBUMIN 4.1 03/19/2008 1920   AST 28 03/19/2008 1920   ALT 21 03/19/2008 1920   ALKPHOS 43 03/19/2008 1920   BILITOT 0.7 03/19/2008 1920   GFRNONAA >90 05/20/2013 1400   GFRAA >90 05/20/2013 1400    Assessment and Plan  Spondylolisthesis of  lumbar region S/p L3-4 and L4-5 decompression, instrumentation and fusion on 3/16.; pt on pain meds, valium for muscle spasms and ASA 81 mg daily for prophylaxis  IBS (irritable bowel syndrome) Continue probiotics  Depression Continue lexapro    Hennie Duos, MD

## 2013-05-25 NOTE — Assessment & Plan Note (Signed)
Continue lexapro  ?

## 2013-06-08 ENCOUNTER — Non-Acute Institutional Stay (SKILLED_NURSING_FACILITY): Payer: Medicare Other | Admitting: Adult Health

## 2013-06-08 ENCOUNTER — Encounter: Payer: Self-pay | Admitting: Adult Health

## 2013-06-08 DIAGNOSIS — F329 Major depressive disorder, single episode, unspecified: Secondary | ICD-10-CM

## 2013-06-08 DIAGNOSIS — F3289 Other specified depressive episodes: Secondary | ICD-10-CM

## 2013-06-08 DIAGNOSIS — F419 Anxiety disorder, unspecified: Secondary | ICD-10-CM | POA: Insufficient documentation

## 2013-06-08 DIAGNOSIS — M4310 Spondylolisthesis, site unspecified: Secondary | ICD-10-CM | POA: Diagnosis not present

## 2013-06-08 DIAGNOSIS — F411 Generalized anxiety disorder: Secondary | ICD-10-CM

## 2013-06-08 DIAGNOSIS — K5900 Constipation, unspecified: Secondary | ICD-10-CM

## 2013-06-08 DIAGNOSIS — F32A Depression, unspecified: Secondary | ICD-10-CM

## 2013-06-08 DIAGNOSIS — G4700 Insomnia, unspecified: Secondary | ICD-10-CM

## 2013-06-08 DIAGNOSIS — M4316 Spondylolisthesis, lumbar region: Secondary | ICD-10-CM

## 2013-06-08 NOTE — Progress Notes (Signed)
Patient ID: Donna Cummings, female   DOB: 08/15/1930, 80 y.o.   MRN: [id number]              PROGRESS NOTE  DATE: 06/08/2013   FACILITY: [facility]  LEVEL OF CARE: SNF (31)  Acute Visit  CHIEF COMPLAINT:  Discharge Notes  HISTORY OF PRESENT ILLNESS: This is an 80 year old female who is for discharge home with Home health PT, OT and Nursing. She has been admitted to [facility] on 05/21/13 from [facility] with Spondylolisthesis of lumbar region S/P L3-4 and L4-5 decompression, instrumentation and fusion.  Patient was admitted to this facility for short-term rehabilitation after the patient's recent hospitalization.  Patient has completed SNF rehabilitation and therapy has cleared the patient for discharge.  Reassessment of ongoing problem(s):  DEPRESSION: The depression remains stable. Patient denies ongoing feelings of sadness, insomnia, anedhonia or lack of appetite. No complications reported from the medications currently being used. Staff do not report behavioral problems.  INSOMNIA: The insomnia remains stable.  No complications noted from the medications presently being used. Patient denies ongoing insomnia, pain, hallucinations, delusions.  CONSTIPATION: The constipation remains stable. No complications from the medications presently being used. Patient denies ongoing constipation, abdominal pain, nausea or vomiting.   PAST MEDICAL HISTORY : Reviewed.  No changes.  CURRENT MEDICATIONS: Reviewed per [facility]  REVIEW OF SYSTEMS:  GENERAL: no change in appetite, no fatigue, no weight changes, no fever, chills or weakness RESPIRATORY: no cough, SOB, DOE, wheezing, hemoptysis CARDIAC: no chest pain, edema or palpitations GI: no abdominal pain, diarrhea, constipation, heart burn, nausea or vomiting  PHYSICAL EXAMINATION  GENERAL: no acute distress, normal body habitus EYES: conjunctivae normal, sclerae normal, normal eye lids NECK: supple, trachea  midline, no neck masses, no thyroid tenderness, no thyromegaly LYMPHATICS: no LAN in the neck, no supraclavicular LAN RESPIRATORY: breathing is even & unlabored, BS CTAB CARDIAC: RRR, no murmur,no extra heart sounds, no edema GI: abdomen soft, normal BS, no masses, no tenderness, no hepatomegaly, no splenomegaly EXTREMITIES: able to ambulate independently PSYCHIATRIC: the patient is alert & oriented to person, affect & behavior appropriate  LABS/RADIOLOGY: Labs reviewed: Basic Metabolic Panel:  Recent Labs  05/15/13 0542 05/19/13 1345 05/20/13 1400  NA 133* 130* 132*  K 4.1 4.0 4.0  CL 97 94* 94*  CO2 25 26 25   GLUCOSE 91 129* 100*  BUN 8 7 8   CREATININE 0.40* 0.44* 0.38*  CALCIUM 8.9 8.7 8.9   CBC:  Recent Labs  05/04/13 1121 05/15/13 0911 05/19/13 1345  WBC 5.3 5.3 4.8  HGB 12.9 9.1* 8.9*  HCT 35.9* 24.5* 24.5*  MCV 100.3* 99.6 99.6  PLT 257 171 245     ASSESSMENT/PLAN:  Spondylolisthesis of lumbar region S/P L3-4 and L4-5 decompression, instrumentation and fusion - for Home health PT, OT and Nursing Depression - continue Lexapro Constipation - continue Colace Anxiety - continue Xanax when necessary Insomnia - recently decreased Ambien to 5 mg by mouth each bedtime when necessary    I have filled out patient's discharge paperwork and written prescriptions.  Patient will receive home health PT, OT and Nursing.   Total discharge time: Less than 30 minutes Discharge time involved coordination of the discharge process with Education officer, museum, nursing staff and therapy department. Medical justification for home health services verified.  CPT CODE: 83419  Seth Bake - NP [facility] [phone]

## 2013-06-13 DIAGNOSIS — M48062 Spinal stenosis, lumbar region with neurogenic claudication: Secondary | ICD-10-CM | POA: Diagnosis not present

## 2013-06-13 DIAGNOSIS — IMO0001 Reserved for inherently not codable concepts without codable children: Secondary | ICD-10-CM | POA: Diagnosis not present

## 2013-06-13 DIAGNOSIS — Z4789 Encounter for other orthopedic aftercare: Secondary | ICD-10-CM | POA: Diagnosis not present

## 2013-06-13 DIAGNOSIS — F3289 Other specified depressive episodes: Secondary | ICD-10-CM | POA: Diagnosis not present

## 2013-06-13 DIAGNOSIS — F329 Major depressive disorder, single episode, unspecified: Secondary | ICD-10-CM | POA: Diagnosis not present

## 2013-06-13 DIAGNOSIS — M4310 Spondylolisthesis, site unspecified: Secondary | ICD-10-CM | POA: Diagnosis not present

## 2013-06-13 DIAGNOSIS — R279 Unspecified lack of coordination: Secondary | ICD-10-CM | POA: Diagnosis not present

## 2013-06-15 DIAGNOSIS — M4310 Spondylolisthesis, site unspecified: Secondary | ICD-10-CM | POA: Diagnosis not present

## 2013-06-15 DIAGNOSIS — F329 Major depressive disorder, single episode, unspecified: Secondary | ICD-10-CM | POA: Diagnosis not present

## 2013-06-15 DIAGNOSIS — R279 Unspecified lack of coordination: Secondary | ICD-10-CM | POA: Diagnosis not present

## 2013-06-15 DIAGNOSIS — IMO0001 Reserved for inherently not codable concepts without codable children: Secondary | ICD-10-CM | POA: Diagnosis not present

## 2013-06-15 DIAGNOSIS — F3289 Other specified depressive episodes: Secondary | ICD-10-CM | POA: Diagnosis not present

## 2013-06-15 DIAGNOSIS — Z4789 Encounter for other orthopedic aftercare: Secondary | ICD-10-CM | POA: Diagnosis not present

## 2013-06-15 DIAGNOSIS — M48062 Spinal stenosis, lumbar region with neurogenic claudication: Secondary | ICD-10-CM | POA: Diagnosis not present

## 2013-06-18 DIAGNOSIS — Z4789 Encounter for other orthopedic aftercare: Secondary | ICD-10-CM | POA: Diagnosis not present

## 2013-06-18 DIAGNOSIS — M4310 Spondylolisthesis, site unspecified: Secondary | ICD-10-CM | POA: Diagnosis not present

## 2013-06-18 DIAGNOSIS — R279 Unspecified lack of coordination: Secondary | ICD-10-CM | POA: Diagnosis not present

## 2013-06-18 DIAGNOSIS — F3289 Other specified depressive episodes: Secondary | ICD-10-CM | POA: Diagnosis not present

## 2013-06-18 DIAGNOSIS — F329 Major depressive disorder, single episode, unspecified: Secondary | ICD-10-CM | POA: Diagnosis not present

## 2013-06-18 DIAGNOSIS — M48062 Spinal stenosis, lumbar region with neurogenic claudication: Secondary | ICD-10-CM | POA: Diagnosis not present

## 2013-06-18 DIAGNOSIS — IMO0001 Reserved for inherently not codable concepts without codable children: Secondary | ICD-10-CM | POA: Diagnosis not present

## 2013-06-19 DIAGNOSIS — R030 Elevated blood-pressure reading, without diagnosis of hypertension: Secondary | ICD-10-CM | POA: Diagnosis not present

## 2013-06-19 DIAGNOSIS — M545 Low back pain, unspecified: Secondary | ICD-10-CM | POA: Diagnosis not present

## 2013-06-19 DIAGNOSIS — M549 Dorsalgia, unspecified: Secondary | ICD-10-CM | POA: Diagnosis not present

## 2013-06-21 DIAGNOSIS — M4310 Spondylolisthesis, site unspecified: Secondary | ICD-10-CM | POA: Diagnosis not present

## 2013-06-21 DIAGNOSIS — Z4789 Encounter for other orthopedic aftercare: Secondary | ICD-10-CM | POA: Diagnosis not present

## 2013-06-21 DIAGNOSIS — R279 Unspecified lack of coordination: Secondary | ICD-10-CM | POA: Diagnosis not present

## 2013-06-21 DIAGNOSIS — IMO0001 Reserved for inherently not codable concepts without codable children: Secondary | ICD-10-CM | POA: Diagnosis not present

## 2013-06-21 DIAGNOSIS — F3289 Other specified depressive episodes: Secondary | ICD-10-CM | POA: Diagnosis not present

## 2013-06-21 DIAGNOSIS — M48062 Spinal stenosis, lumbar region with neurogenic claudication: Secondary | ICD-10-CM | POA: Diagnosis not present

## 2013-06-21 DIAGNOSIS — F329 Major depressive disorder, single episode, unspecified: Secondary | ICD-10-CM | POA: Diagnosis not present

## 2013-06-22 DIAGNOSIS — M48061 Spinal stenosis, lumbar region without neurogenic claudication: Secondary | ICD-10-CM | POA: Diagnosis not present

## 2013-06-22 DIAGNOSIS — F3289 Other specified depressive episodes: Secondary | ICD-10-CM | POA: Diagnosis not present

## 2013-06-22 DIAGNOSIS — F329 Major depressive disorder, single episode, unspecified: Secondary | ICD-10-CM | POA: Diagnosis not present

## 2013-06-22 DIAGNOSIS — K589 Irritable bowel syndrome without diarrhea: Secondary | ICD-10-CM | POA: Diagnosis not present

## 2013-06-26 DIAGNOSIS — M48062 Spinal stenosis, lumbar region with neurogenic claudication: Secondary | ICD-10-CM | POA: Diagnosis not present

## 2013-06-26 DIAGNOSIS — F3289 Other specified depressive episodes: Secondary | ICD-10-CM | POA: Diagnosis not present

## 2013-06-26 DIAGNOSIS — R279 Unspecified lack of coordination: Secondary | ICD-10-CM | POA: Diagnosis not present

## 2013-06-26 DIAGNOSIS — F329 Major depressive disorder, single episode, unspecified: Secondary | ICD-10-CM | POA: Diagnosis not present

## 2013-06-26 DIAGNOSIS — Z4789 Encounter for other orthopedic aftercare: Secondary | ICD-10-CM | POA: Diagnosis not present

## 2013-06-26 DIAGNOSIS — IMO0001 Reserved for inherently not codable concepts without codable children: Secondary | ICD-10-CM | POA: Diagnosis not present

## 2013-06-26 DIAGNOSIS — M4310 Spondylolisthesis, site unspecified: Secondary | ICD-10-CM | POA: Diagnosis not present

## 2013-06-28 DIAGNOSIS — F329 Major depressive disorder, single episode, unspecified: Secondary | ICD-10-CM | POA: Diagnosis not present

## 2013-06-28 DIAGNOSIS — Z4789 Encounter for other orthopedic aftercare: Secondary | ICD-10-CM | POA: Diagnosis not present

## 2013-06-28 DIAGNOSIS — F3289 Other specified depressive episodes: Secondary | ICD-10-CM | POA: Diagnosis not present

## 2013-06-28 DIAGNOSIS — R279 Unspecified lack of coordination: Secondary | ICD-10-CM | POA: Diagnosis not present

## 2013-06-28 DIAGNOSIS — M4310 Spondylolisthesis, site unspecified: Secondary | ICD-10-CM | POA: Diagnosis not present

## 2013-06-28 DIAGNOSIS — M48062 Spinal stenosis, lumbar region with neurogenic claudication: Secondary | ICD-10-CM | POA: Diagnosis not present

## 2013-06-28 DIAGNOSIS — IMO0001 Reserved for inherently not codable concepts without codable children: Secondary | ICD-10-CM | POA: Diagnosis not present

## 2013-07-03 DIAGNOSIS — IMO0001 Reserved for inherently not codable concepts without codable children: Secondary | ICD-10-CM | POA: Diagnosis not present

## 2013-07-03 DIAGNOSIS — F3289 Other specified depressive episodes: Secondary | ICD-10-CM | POA: Diagnosis not present

## 2013-07-03 DIAGNOSIS — F329 Major depressive disorder, single episode, unspecified: Secondary | ICD-10-CM | POA: Diagnosis not present

## 2013-07-03 DIAGNOSIS — M4310 Spondylolisthesis, site unspecified: Secondary | ICD-10-CM | POA: Diagnosis not present

## 2013-07-03 DIAGNOSIS — R279 Unspecified lack of coordination: Secondary | ICD-10-CM | POA: Diagnosis not present

## 2013-07-03 DIAGNOSIS — M48062 Spinal stenosis, lumbar region with neurogenic claudication: Secondary | ICD-10-CM | POA: Diagnosis not present

## 2013-07-03 DIAGNOSIS — Z4789 Encounter for other orthopedic aftercare: Secondary | ICD-10-CM | POA: Diagnosis not present

## 2013-07-30 DIAGNOSIS — D539 Nutritional anemia, unspecified: Secondary | ICD-10-CM | POA: Diagnosis not present

## 2013-07-30 DIAGNOSIS — Z681 Body mass index (BMI) 19 or less, adult: Secondary | ICD-10-CM | POA: Diagnosis not present

## 2013-07-30 DIAGNOSIS — F329 Major depressive disorder, single episode, unspecified: Secondary | ICD-10-CM | POA: Diagnosis not present

## 2013-07-30 DIAGNOSIS — F3289 Other specified depressive episodes: Secondary | ICD-10-CM | POA: Diagnosis not present

## 2013-07-30 DIAGNOSIS — J449 Chronic obstructive pulmonary disease, unspecified: Secondary | ICD-10-CM | POA: Diagnosis not present

## 2013-07-30 DIAGNOSIS — R5381 Other malaise: Secondary | ICD-10-CM | POA: Diagnosis not present

## 2013-07-30 DIAGNOSIS — R5383 Other fatigue: Secondary | ICD-10-CM | POA: Diagnosis not present

## 2013-07-30 DIAGNOSIS — K219 Gastro-esophageal reflux disease without esophagitis: Secondary | ICD-10-CM | POA: Diagnosis not present

## 2013-08-24 DIAGNOSIS — E785 Hyperlipidemia, unspecified: Secondary | ICD-10-CM | POA: Diagnosis not present

## 2013-08-24 DIAGNOSIS — F3289 Other specified depressive episodes: Secondary | ICD-10-CM | POA: Diagnosis not present

## 2013-08-24 DIAGNOSIS — F329 Major depressive disorder, single episode, unspecified: Secondary | ICD-10-CM | POA: Diagnosis not present

## 2013-08-24 DIAGNOSIS — M810 Age-related osteoporosis without current pathological fracture: Secondary | ICD-10-CM | POA: Diagnosis not present

## 2013-09-06 DIAGNOSIS — I6529 Occlusion and stenosis of unspecified carotid artery: Secondary | ICD-10-CM | POA: Diagnosis not present

## 2013-09-06 DIAGNOSIS — R252 Cramp and spasm: Secondary | ICD-10-CM | POA: Diagnosis not present

## 2013-09-06 DIAGNOSIS — J449 Chronic obstructive pulmonary disease, unspecified: Secondary | ICD-10-CM | POA: Diagnosis not present

## 2013-09-06 DIAGNOSIS — Z0000 Encounter for general adult medical examination without abnormal findings: Secondary | ICD-10-CM | POA: Diagnosis not present

## 2013-09-06 DIAGNOSIS — F329 Major depressive disorder, single episode, unspecified: Secondary | ICD-10-CM | POA: Diagnosis not present

## 2013-09-06 DIAGNOSIS — Z681 Body mass index (BMI) 19 or less, adult: Secondary | ICD-10-CM | POA: Diagnosis not present

## 2013-09-06 DIAGNOSIS — F3289 Other specified depressive episodes: Secondary | ICD-10-CM | POA: Diagnosis not present

## 2013-09-06 DIAGNOSIS — Z1331 Encounter for screening for depression: Secondary | ICD-10-CM | POA: Diagnosis not present

## 2013-09-06 DIAGNOSIS — Z23 Encounter for immunization: Secondary | ICD-10-CM | POA: Diagnosis not present

## 2013-09-06 DIAGNOSIS — E785 Hyperlipidemia, unspecified: Secondary | ICD-10-CM | POA: Diagnosis not present

## 2013-09-14 DIAGNOSIS — Z1212 Encounter for screening for malignant neoplasm of rectum: Secondary | ICD-10-CM | POA: Diagnosis not present

## 2013-09-24 DIAGNOSIS — H04129 Dry eye syndrome of unspecified lacrimal gland: Secondary | ICD-10-CM | POA: Diagnosis not present

## 2013-09-24 DIAGNOSIS — H2510 Age-related nuclear cataract, unspecified eye: Secondary | ICD-10-CM | POA: Diagnosis not present

## 2013-10-03 DIAGNOSIS — R609 Edema, unspecified: Secondary | ICD-10-CM | POA: Diagnosis not present

## 2013-10-03 DIAGNOSIS — M48061 Spinal stenosis, lumbar region without neurogenic claudication: Secondary | ICD-10-CM | POA: Diagnosis not present

## 2013-10-03 DIAGNOSIS — Z681 Body mass index (BMI) 19 or less, adult: Secondary | ICD-10-CM | POA: Diagnosis not present

## 2013-10-23 DIAGNOSIS — L259 Unspecified contact dermatitis, unspecified cause: Secondary | ICD-10-CM | POA: Diagnosis not present

## 2013-10-23 DIAGNOSIS — M810 Age-related osteoporosis without current pathological fracture: Secondary | ICD-10-CM | POA: Diagnosis not present

## 2013-10-23 DIAGNOSIS — L821 Other seborrheic keratosis: Secondary | ICD-10-CM | POA: Diagnosis not present

## 2013-10-23 DIAGNOSIS — Z85828 Personal history of other malignant neoplasm of skin: Secondary | ICD-10-CM | POA: Diagnosis not present

## 2013-10-30 DIAGNOSIS — M545 Low back pain, unspecified: Secondary | ICD-10-CM | POA: Diagnosis not present

## 2013-10-30 DIAGNOSIS — Q762 Congenital spondylolisthesis: Secondary | ICD-10-CM | POA: Diagnosis not present

## 2013-11-14 ENCOUNTER — Telehealth: Payer: Self-pay | Admitting: Internal Medicine

## 2013-11-14 DIAGNOSIS — D509 Iron deficiency anemia, unspecified: Secondary | ICD-10-CM

## 2013-11-14 NOTE — Telephone Encounter (Signed)
Left message for patient to call back  

## 2013-11-16 NOTE — Telephone Encounter (Signed)
Patient was asking if she needs screenings for blood in her stool.  I explained that at her age we don't do routine screening.  She also has alternating bowel habits.  She wants to wait to schedule an appt.  She reports that she is feeling better at this time.  She will call back if she has any additional questions or concerns

## 2013-12-04 ENCOUNTER — Telehealth: Payer: Self-pay | Admitting: Internal Medicine

## 2013-12-04 NOTE — Telephone Encounter (Signed)
Patient with alternating bowel habits and possible blood in her stool.  She will come in and see Dr. Carlean Purl tomorrow at [place]

## 2013-12-05 ENCOUNTER — Ambulatory Visit (INDEPENDENT_AMBULATORY_CARE_PROVIDER_SITE_OTHER): Payer: Medicare Other | Admitting: Internal Medicine

## 2013-12-05 ENCOUNTER — Encounter: Payer: Self-pay | Admitting: Internal Medicine

## 2013-12-05 VITALS — BP 110/60 | HR 64 | Ht 62.25 in | Wt 85.1 lb

## 2013-12-05 DIAGNOSIS — K5900 Constipation, unspecified: Secondary | ICD-10-CM | POA: Diagnosis not present

## 2013-12-05 DIAGNOSIS — K644 Residual hemorrhoidal skin tags: Secondary | ICD-10-CM | POA: Insufficient documentation

## 2013-12-05 DIAGNOSIS — K648 Other hemorrhoids: Secondary | ICD-10-CM

## 2013-12-05 DIAGNOSIS — K5909 Other constipation: Secondary | ICD-10-CM | POA: Insufficient documentation

## 2013-12-05 NOTE — Progress Notes (Signed)
    History of Present Illness: The patient is a pleasant 80 year old female with a history of an L3-4, L4-5 spondylolisthesis, lumbar radiculopathy, and neurogenic claudication. On 05/14/2013 she underwent an L3-4 and L4-5 decompression and fusion. She subsequently went to rehabilitation. Over the past 2 months she has had several episodes of rust-colored stools. No melena or gross red blood.. Review of her chart shows that she had a colonoscopy in May of 2013 by Dr. Carlean Purl at which time a sessile serrated adenoma was removed and she is on the recall list for considering repeat colonoscopy in 2016 she also had a colonoscopy in November of 2005 by Dr. Ladean Raya  at which time she was noted to have internal hemorrhoids. At this time she has no abdominal pain.  She has no rectal pain or itching. She has no obvious bright red blood per rectum and no melena. She has been having some hard stools and has periodically used stool softeners and an herbal laxative. When she does use these she tends to have diarrhea. If she does not use a stool softener or an herbal laxative she states her stools are hard nuggets that she has to strain to pass.   Current Medications, Allergies, Past Medical History, Past Surgical History, Family History and Social History were reviewed in [facility] record   ROS as above   Physical Exam: General: Pleasant, well developed , well developed female in no acute distress Head: Normocephalic and atraumatic Eyes:  sclerae anicteric, conjunctiva pink  Ears: Normal auditory acuity Lungs: Clear throughout to auscultation Heart: Regular rate and rhythm Abdomen: Soft, non distended, non-tender. No masses, no hepatomegaly. Normal bowel sounds Rectal: perianal skin tags noted. Anoscopy performed by Dr Carlean Purl. Musculoskeletal: Symmetrical with no gross deformities  Extremities: No edema  Neurological: Alert oriented x 4, grossly nonfocal Psychological:  Alert and  cooperative. Normal mood and affect  Anoscopy - performed in presence of Ms. Hermine Feria and shows  external hemorrhoids grade 1 all 3 positions Gatha Mayer, MD, [facility]   Assessment and Recommendations: 1. Bleeding external hemorrhoids. Pt was noted to have hemorrhoids at anoscopy, though none actively bleeding. Pt instructed to increase fiber to decrease constipation. She was instructed to notify us immediately if she has a large amount of BRBPR, burgundy or black stools, but a small amount of blood  With BMs periodically may be due to her hemorrhoids. 2.Constipation. Pt has been instructed to increase fiber and water intake daily and will try eating several prunes daily. She will follow up as needed for this problem.  I saw patient with Cecille Rubin  Charvez Voorhies, PA-C who also served as Education administrator.  Gatha Mayer, MD, [facility]   TO:[email or web address] A, MD

## 2013-12-05 NOTE — Patient Instructions (Signed)
Please use 3-4 prunes daily to help with your constipation.   Follow up with [place] as needed.    I appreciate the opportunity to care for you.

## 2013-12-18 DIAGNOSIS — M810 Age-related osteoporosis without current pathological fracture: Secondary | ICD-10-CM | POA: Diagnosis not present

## 2013-12-18 DIAGNOSIS — Z681 Body mass index (BMI) 19 or less, adult: Secondary | ICD-10-CM | POA: Diagnosis not present

## 2014-01-02 DIAGNOSIS — H2513 Age-related nuclear cataract, bilateral: Secondary | ICD-10-CM | POA: Diagnosis not present

## 2014-01-02 DIAGNOSIS — H2589 Other age-related cataract: Secondary | ICD-10-CM | POA: Diagnosis not present

## 2014-01-02 DIAGNOSIS — H04123 Dry eye syndrome of bilateral lacrimal glands: Secondary | ICD-10-CM | POA: Diagnosis not present

## 2014-01-07 DIAGNOSIS — Z681 Body mass index (BMI) 19 or less, adult: Secondary | ICD-10-CM | POA: Diagnosis not present

## 2014-01-07 DIAGNOSIS — F329 Major depressive disorder, single episode, unspecified: Secondary | ICD-10-CM | POA: Diagnosis not present

## 2014-01-07 DIAGNOSIS — Z283 Underimmunization status: Secondary | ICD-10-CM | POA: Diagnosis not present

## 2014-01-07 DIAGNOSIS — S6991XA Unspecified injury of right wrist, hand and finger(s), initial encounter: Secondary | ICD-10-CM | POA: Diagnosis not present

## 2014-01-07 DIAGNOSIS — Z23 Encounter for immunization: Secondary | ICD-10-CM | POA: Diagnosis not present

## 2014-01-07 DIAGNOSIS — M810 Age-related osteoporosis without current pathological fracture: Secondary | ICD-10-CM | POA: Diagnosis not present

## 2014-01-30 DIAGNOSIS — M810 Age-related osteoporosis without current pathological fracture: Secondary | ICD-10-CM | POA: Diagnosis not present

## 2014-01-30 DIAGNOSIS — Z681 Body mass index (BMI) 19 or less, adult: Secondary | ICD-10-CM | POA: Diagnosis not present

## 2014-02-05 ENCOUNTER — Ambulatory Visit: Payer: Medicare Other | Admitting: Physical Therapy

## 2014-02-06 ENCOUNTER — Ambulatory Visit: Payer: Medicare Other | Admitting: Podiatrist

## 2014-02-13 ENCOUNTER — Ambulatory Visit (INDEPENDENT_AMBULATORY_CARE_PROVIDER_SITE_OTHER): Payer: Medicare Other | Admitting: Podiatry

## 2014-02-13 ENCOUNTER — Encounter: Payer: Self-pay | Admitting: Podiatry

## 2014-02-13 VITALS — BP 126/63 | HR 59 | Resp 16 | Ht 63.0 in | Wt 85.0 lb

## 2014-02-13 DIAGNOSIS — L03032 Cellulitis of left toe: Secondary | ICD-10-CM | POA: Diagnosis not present

## 2014-02-13 NOTE — Progress Notes (Signed)
   Subjective:    Patient ID: Donna Cummings, female    DOB: [birth year], 80 y.o.   MRN: [id number]  HPI Comments: i have a sore toenail on my left foot big toe. This has been going on for 3 weeks. Its getting worse. Its very painful. Everything bothers my toe. i used peroxide and that's it.     Review of Systems  HENT: Positive for hearing loss.   Gastrointestinal: Positive for diarrhea and constipation.  Musculoskeletal:       Difficulty walking  Neurological: Positive for weakness.  Hematological: Bruises/bleeds easily.  Psychiatric/Behavioral: Positive for confusion.  All other systems reviewed and are negative.      Objective:   Physical Exam        Assessment & Plan:

## 2014-02-13 NOTE — Patient Instructions (Signed)

## 2014-02-14 NOTE — Progress Notes (Signed)
Subjective:     Patient ID: Donna Cummings, female   DOB: 05-10-1930, 80 y.o.   MRN: [id number]  HPI patient presents with a painful medial left hallux border that is red and sore and also states her whole nail gives her to trouble at times and she is going out of town   Review of Systems  All other systems reviewed and are negative.      Objective:   Physical Exam  Constitutional: She is oriented to person, place, and time.  Cardiovascular: Intact distal pulses.   Musculoskeletal: Normal range of motion.  Neurological: She is oriented to person, place, and time.  Skin: Skin is warm and dry.  Nursing note and vitals reviewed.  neurovascular status was found to be intact with muscle strength adequate and range of motion of the subtalar midtarsal joint mildly diminished but within normal limits. Patient has good digital perfusion is well oriented 3 no equinus condition is noted. I noted there to be a damaged thick left hallux nail that's loose on the distal one third with incurvation of the medial border with distal redness noted and slight drainage of the localized nature     Assessment:     Mild paronychia left hallux medial side with damaged left hallux nail    Plan:     H&P and condition discussed and at this time I infiltrated the left hallux 69 g like Marcaine mixture removed the medial border cleaned up the distal one third of the nailbed and applied sterile dressing. Discussed ultimately this may require permanent procedure

## 2014-03-08 ENCOUNTER — Emergency Department (HOSPITAL_COMMUNITY): Payer: Medicare Other

## 2014-03-08 ENCOUNTER — Observation Stay (HOSPITAL_COMMUNITY)
Admission: EM | Admit: 2014-03-08 | Discharge: 2014-03-10 | Disposition: A | Discharge status: Home / Self Care | Payer: Medicare Other | Attending: Internal Medicine | Admitting: Internal Medicine

## 2014-03-08 ENCOUNTER — Encounter (HOSPITAL_COMMUNITY): Payer: Self-pay | Admitting: *Deleted

## 2014-03-08 DIAGNOSIS — Z87891 Personal history of nicotine dependence: Secondary | ICD-10-CM | POA: Insufficient documentation

## 2014-03-08 DIAGNOSIS — J439 Emphysema, unspecified: Secondary | ICD-10-CM | POA: Diagnosis not present

## 2014-03-08 DIAGNOSIS — T1490XA Injury, unspecified, initial encounter: Secondary | ICD-10-CM

## 2014-03-08 DIAGNOSIS — S3991XA Unspecified injury of abdomen, initial encounter: Secondary | ICD-10-CM | POA: Diagnosis not present

## 2014-03-08 DIAGNOSIS — E871 Hypo-osmolality and hyponatremia: Secondary | ICD-10-CM | POA: Diagnosis not present

## 2014-03-08 DIAGNOSIS — S199XXA Unspecified injury of neck, initial encounter: Secondary | ICD-10-CM | POA: Diagnosis not present

## 2014-03-08 DIAGNOSIS — Y92410 Unspecified street and highway as the place of occurrence of the external cause: Secondary | ICD-10-CM | POA: Insufficient documentation

## 2014-03-08 DIAGNOSIS — S3690XA Unspecified injury of unspecified intra-abdominal organ, initial encounter: Secondary | ICD-10-CM | POA: Diagnosis not present

## 2014-03-08 DIAGNOSIS — Z8673 Personal history of transient ischemic attack (TIA), and cerebral infarction without residual deficits: Secondary | ICD-10-CM | POA: Insufficient documentation

## 2014-03-08 DIAGNOSIS — Z86010 Personal history of colonic polyps: Secondary | ICD-10-CM | POA: Insufficient documentation

## 2014-03-08 DIAGNOSIS — S8992XA Unspecified injury of left lower leg, initial encounter: Secondary | ICD-10-CM | POA: Diagnosis not present

## 2014-03-08 DIAGNOSIS — S0012XA Contusion of left eyelid and periocular area, initial encounter: Secondary | ICD-10-CM | POA: Insufficient documentation

## 2014-03-08 DIAGNOSIS — K219 Gastro-esophageal reflux disease without esophagitis: Secondary | ICD-10-CM | POA: Insufficient documentation

## 2014-03-08 DIAGNOSIS — Z471 Aftercare following joint replacement surgery: Secondary | ICD-10-CM | POA: Diagnosis not present

## 2014-03-08 DIAGNOSIS — S060X9A Concussion with loss of consciousness of unspecified duration, initial encounter: Secondary | ICD-10-CM | POA: Diagnosis not present

## 2014-03-08 DIAGNOSIS — Z79899 Other long term (current) drug therapy: Secondary | ICD-10-CM | POA: Diagnosis not present

## 2014-03-08 DIAGNOSIS — Z880 Allergy status to penicillin: Secondary | ICD-10-CM | POA: Diagnosis not present

## 2014-03-08 DIAGNOSIS — F329 Major depressive disorder, single episode, unspecified: Secondary | ICD-10-CM | POA: Diagnosis not present

## 2014-03-08 DIAGNOSIS — S0990XA Unspecified injury of head, initial encounter: Secondary | ICD-10-CM | POA: Diagnosis not present

## 2014-03-08 DIAGNOSIS — S299XXA Unspecified injury of thorax, initial encounter: Secondary | ICD-10-CM | POA: Diagnosis not present

## 2014-03-08 DIAGNOSIS — Z7982 Long term (current) use of aspirin: Secondary | ICD-10-CM | POA: Diagnosis not present

## 2014-03-08 DIAGNOSIS — Z96642 Presence of left artificial hip joint: Secondary | ICD-10-CM | POA: Diagnosis not present

## 2014-03-08 DIAGNOSIS — S3993XA Unspecified injury of pelvis, initial encounter: Secondary | ICD-10-CM | POA: Diagnosis not present

## 2014-03-08 DIAGNOSIS — S0993XA Unspecified injury of face, initial encounter: Secondary | ICD-10-CM | POA: Diagnosis not present

## 2014-03-08 DIAGNOSIS — R51 Headache: Secondary | ICD-10-CM | POA: Diagnosis not present

## 2014-03-08 DIAGNOSIS — R569 Unspecified convulsions: Secondary | ICD-10-CM | POA: Insufficient documentation

## 2014-03-08 DIAGNOSIS — M79662 Pain in left lower leg: Secondary | ICD-10-CM | POA: Diagnosis not present

## 2014-03-08 DIAGNOSIS — M79605 Pain in left leg: Secondary | ICD-10-CM | POA: Insufficient documentation

## 2014-03-08 DIAGNOSIS — R110 Nausea: Secondary | ICD-10-CM | POA: Diagnosis not present

## 2014-03-08 DIAGNOSIS — T1490 Injury, unspecified: Secondary | ICD-10-CM | POA: Diagnosis present

## 2014-03-08 DIAGNOSIS — M545 Low back pain: Secondary | ICD-10-CM | POA: Diagnosis not present

## 2014-03-08 LAB — I-STAT CHEM 8, ED
BUN: 19 mg/dL (ref 6–23)
CHLORIDE: 92 meq/L — AB (ref 96–112)
Calcium, Ion: 1.18 mmol/L (ref 1.13–1.30)
Creatinine, Ser: 0.3 mg/dL — ABNORMAL LOW (ref 0.50–1.10)
GLUCOSE: 113 mg/dL — AB (ref 70–99)
HCT: 34 % — ABNORMAL LOW (ref 36.0–46.0)
Hemoglobin: 11.6 g/dL — ABNORMAL LOW (ref 12.0–15.0)
Potassium: 3.2 mmol/L — ABNORMAL LOW (ref 3.5–5.1)
Sodium: 126 mmol/L — ABNORMAL LOW (ref 135–145)
TCO2: 22 mmol/L (ref 0–100)

## 2014-03-08 LAB — BASIC METABOLIC PANEL
ANION GAP: 7 (ref 5–15)
BUN: 18 mg/dL (ref 6–23)
CALCIUM: 8.9 mg/dL (ref 8.4–10.5)
CO2: 26 mmol/L (ref 19–32)
CREATININE: 0.38 mg/dL — AB (ref 0.50–1.10)
Chloride: 93 mEq/L — ABNORMAL LOW (ref 96–112)
GFR calc Af Amer: >90 mL/min (ref >90)
GFR calc non Af Amer: >90 mL/min (ref >90)
GLUCOSE: 110 mg/dL — AB (ref 70–99)
POTASSIUM: 3.1 mmol/L — AB (ref 3.5–5.1)
SODIUM: 126 mmol/L — AB (ref 135–145)

## 2014-03-08 LAB — PROTIME-INR
INR: 1.1 (ref 0.00–1.49)
PROTHROMBIN TIME: 14.3 s (ref 11.6–15.2)

## 2014-03-08 LAB — I-STAT TROPONIN, ED: Troponin i, poc: 0 ng/mL (ref 0.00–0.08)

## 2014-03-08 LAB — CBG MONITORING, ED: Glucose-Capillary: 112 mg/dL — ABNORMAL HIGH (ref 70–99)

## 2014-03-08 MED ORDER — OMEGA-3-ACID ETHYL ESTERS 1 G PO CAPS
1.0000 g | ORAL_CAPSULE | Freq: Every day | ORAL | Status: DC
  Administered 2014-03-09 – 2014-03-10 (×2): 1 g via ORAL
  Filled 2014-03-08 (×2): qty 1

## 2014-03-08 MED ORDER — ADULT MULTIVITAMIN W/MINERALS CH
1.0000 | ORAL_TABLET | Freq: Every day | ORAL | Status: DC
  Administered 2014-03-09 – 2014-03-10 (×2): 1 via ORAL
  Filled 2014-03-08 (×2): qty 1

## 2014-03-08 MED ORDER — ZOLPIDEM TARTRATE 5 MG PO TABS
5.0000 mg | ORAL_TABLET | Freq: Every evening | ORAL | Status: DC | PRN
  Administered 2014-03-09 – 2014-03-10 (×2): 5 mg via ORAL
  Filled 2014-03-08 (×2): qty 1

## 2014-03-08 MED ORDER — IOHEXOL 300 MG/ML  SOLN
75.0000 mL | Freq: Once | INTRAMUSCULAR | Status: AC | PRN
  Administered 2014-03-08: 75 mL via INTRAVENOUS

## 2014-03-08 MED ORDER — VITAMIN D3 25 MCG (1000 UNIT) PO TABS
2000.0000 [IU] | ORAL_TABLET | Freq: Every day | ORAL | Status: DC
  Administered 2014-03-09 – 2014-03-10 (×2): 2000 [IU] via ORAL
  Filled 2014-03-08 (×2): qty 2

## 2014-03-08 MED ORDER — POLYVINYL ALCOHOL 1.4 % OP SOLN: 1.0000 [drp] | OPHTHALMIC | Status: DC | PRN

## 2014-03-08 MED ORDER — SODIUM CHLORIDE 0.9 % IV BOLUS (SEPSIS)
1000.0000 mL | Freq: Once | INTRAVENOUS | Status: AC
  Administered 2014-03-08: 1000 mL via INTRAVENOUS

## 2014-03-08 MED ORDER — POTASSIUM CHLORIDE CRYS ER 20 MEQ PO TBCR
40.0000 meq | EXTENDED_RELEASE_TABLET | Freq: Once | ORAL | Status: AC
  Administered 2014-03-09: 40 meq via ORAL
  Filled 2014-03-08: qty 2

## 2014-03-08 MED ORDER — ONDANSETRON HCL 4 MG/2ML IJ SOLN
INTRAMUSCULAR | Status: AC
  Filled 2014-03-08: qty 2

## 2014-03-08 MED ORDER — FENTANYL CITRATE 0.05 MG/ML IJ SOLN: 50.0000 ug | INTRAMUSCULAR | Status: DC | PRN

## 2014-03-08 MED ORDER — SODIUM CHLORIDE 0.9 % IV SOLN
INTRAVENOUS | Status: DC
  Administered 2014-03-09: 03:00:00 via INTRAVENOUS

## 2014-03-08 MED ORDER — ONDANSETRON HCL 4 MG/2ML IJ SOLN
4.0000 mg | Freq: Once | INTRAMUSCULAR | Status: AC
  Administered 2014-03-08: 4 mg via INTRAVENOUS

## 2014-03-08 MED ORDER — ASPIRIN EC 81 MG PO TBEC
81.0000 mg | DELAYED_RELEASE_TABLET | Freq: Every day | ORAL | Status: DC
  Administered 2014-03-09 – 2014-03-10 (×2): 81 mg via ORAL
  Filled 2014-03-08 (×2): qty 1

## 2014-03-08 MED ORDER — ESCITALOPRAM OXALATE 5 MG PO TABS
5.0000 mg | ORAL_TABLET | Freq: Every day | ORAL | Status: DC
  Administered 2014-03-09 – 2014-03-10 (×2): 5 mg via ORAL
  Filled 2014-03-08 (×2): qty 1

## 2014-03-08 MED ORDER — DOCUSATE SODIUM 100 MG PO CAPS
100.0000 mg | ORAL_CAPSULE | Freq: Two times a day (BID) | ORAL | Status: DC
  Administered 2014-03-09 – 2014-03-10 (×3): 100 mg via ORAL
  Filled 2014-03-08 (×5): qty 1

## 2014-03-08 NOTE — ED Notes (Signed)
MD James at bedside.  
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
## 2014-03-08 NOTE — ED Notes (Signed)
Patient c/o "feeling sick".  HOB up and green bag given,  Soft collar applied per Dr. Jeneen Rinks.

## 2014-03-08 NOTE — ED Notes (Signed)
Daughter in law here to check on patient.  States my mother is in a room on third floor so I am going back up to check on her.  Phone number obtained in case we need her.  [phone] Legrand Rams).

## 2014-03-08 NOTE — ED Notes (Signed)
Pt in via EMS to triage, taken in wheelchair to triage room, during report from EMS pt began to c/o nausea, pt started seizing, no history of same, taken to trauma room
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
## 2014-03-09 ENCOUNTER — Observation Stay (HOSPITAL_COMMUNITY): Payer: Medicare Other

## 2014-03-09 DIAGNOSIS — S0012XA Contusion of left eyelid and periocular area, initial encounter: Secondary | ICD-10-CM

## 2014-03-09 DIAGNOSIS — S060X1A Concussion with loss of consciousness of 30 minutes or less, initial encounter: Secondary | ICD-10-CM | POA: Diagnosis not present

## 2014-03-09 DIAGNOSIS — R569 Unspecified convulsions: Secondary | ICD-10-CM | POA: Diagnosis not present

## 2014-03-09 DIAGNOSIS — S0990XA Unspecified injury of head, initial encounter: Secondary | ICD-10-CM | POA: Diagnosis not present

## 2014-03-09 DIAGNOSIS — S060X9A Concussion with loss of consciousness of unspecified duration, initial encounter: Secondary | ICD-10-CM | POA: Diagnosis not present

## 2014-03-09 DIAGNOSIS — E871 Hypo-osmolality and hyponatremia: Secondary | ICD-10-CM | POA: Diagnosis not present

## 2014-03-09 LAB — CBC
HEMATOCRIT: 28.5 % — AB (ref 36.0–46.0)
Hemoglobin: 10.1 g/dL — ABNORMAL LOW (ref 12.0–15.0)
MCH: 34.1 pg — ABNORMAL HIGH (ref 26.0–34.0)
MCHC: 35.4 g/dL (ref 30.0–36.0)
MCV: 96.3 fL (ref 78.0–100.0)
Platelets: 217 10*3/uL (ref 150–400)
RBC: 2.96 MIL/uL — ABNORMAL LOW (ref 3.87–5.11)
RDW: 12.5 % (ref 11.5–15.5)
WBC: 5.1 10*3/uL (ref 4.0–10.5)

## 2014-03-09 LAB — TSH: TSH: 1.398 u[IU]/mL (ref 0.350–4.500)

## 2014-03-09 LAB — BASIC METABOLIC PANEL
ANION GAP: 5 (ref 5–15)
BUN: 10 mg/dL (ref 6–23)
CO2: 23 mmol/L (ref 19–32)
Calcium: 7.9 mg/dL — ABNORMAL LOW (ref 8.4–10.5)
Chloride: 102 mEq/L (ref 96–112)
Creatinine, Ser: 0.43 mg/dL — ABNORMAL LOW (ref 0.50–1.10)
GFR calc Af Amer: >90 mL/min (ref >90)
GFR calc non Af Amer: >90 mL/min (ref >90)
Glucose, Bld: 78 mg/dL (ref 70–99)
POTASSIUM: 3.8 mmol/L (ref 3.5–5.1)
Sodium: 130 mmol/L — ABNORMAL LOW (ref 135–145)

## 2014-03-09 MED ORDER — SENNA 8.6 MG PO TABS
2.0000 | ORAL_TABLET | Freq: Every evening | ORAL | Status: DC | PRN
  Administered 2014-03-09: 17.2 mg via ORAL
  Filled 2014-03-09 (×2): qty 2

## 2014-03-09 NOTE — Progress Notes (Signed)
Utilization Review completed.  

## 2014-03-09 NOTE — ED Notes (Signed)
Patient sitting up in bed eating sandwich and chicken broth. Tolerating well.
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
## 2014-03-10 DIAGNOSIS — R569 Unspecified convulsions: Secondary | ICD-10-CM | POA: Diagnosis not present

## 2014-03-10 DIAGNOSIS — S0012XA Contusion of left eyelid and periocular area, initial encounter: Secondary | ICD-10-CM | POA: Insufficient documentation

## 2014-03-10 DIAGNOSIS — S060X9A Concussion with loss of consciousness of unspecified duration, initial encounter: Secondary | ICD-10-CM | POA: Insufficient documentation

## 2014-03-10 DIAGNOSIS — E871 Hypo-osmolality and hyponatremia: Secondary | ICD-10-CM | POA: Diagnosis not present

## 2014-03-10 DIAGNOSIS — S060X1A Concussion with loss of consciousness of 30 minutes or less, initial encounter: Secondary | ICD-10-CM | POA: Diagnosis not present

## 2014-03-10 LAB — BASIC METABOLIC PANEL
Anion gap: 4 — ABNORMAL LOW (ref 5–15)
BUN: 9 mg/dL (ref 6–23)
CHLORIDE: 104 meq/L (ref 96–112)
CO2: 26 mmol/L (ref 19–32)
Calcium: 8.6 mg/dL (ref 8.4–10.5)
Creatinine, Ser: 0.38 mg/dL — ABNORMAL LOW (ref 0.50–1.10)
GFR calc Af Amer: >90 mL/min (ref >90)
GFR calc non Af Amer: >90 mL/min (ref >90)
Glucose, Bld: 91 mg/dL (ref 70–99)
POTASSIUM: 3.8 mmol/L (ref 3.5–5.1)
Sodium: 134 mmol/L — ABNORMAL LOW (ref 135–145)

## 2014-03-10 LAB — T4, FREE: FREE T4: 0.99 ng/dL (ref 0.80–1.80)
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
## 2014-03-10 NOTE — Progress Notes (Signed)
UR completed 

## 2014-03-13 ENCOUNTER — Ambulatory Visit: Payer: Medicare Other | Admitting: Podiatry

## 2014-03-13 DIAGNOSIS — S060X9A Concussion with loss of consciousness of unspecified duration, initial encounter: Secondary | ICD-10-CM | POA: Diagnosis not present

## 2014-03-14 ENCOUNTER — Ambulatory Visit: Payer: Medicare Other | Attending: Neurosurgery | Admitting: Physical Therapy

## 2014-03-14 ENCOUNTER — Ambulatory Visit (INDEPENDENT_AMBULATORY_CARE_PROVIDER_SITE_OTHER): Payer: Medicare Other | Admitting: Neurology

## 2014-03-14 ENCOUNTER — Telehealth: Payer: Self-pay | Admitting: Neurology

## 2014-03-14 DIAGNOSIS — M25562 Pain in left knee: Secondary | ICD-10-CM | POA: Insufficient documentation

## 2014-03-14 DIAGNOSIS — R262 Difficulty in walking, not elsewhere classified: Secondary | ICD-10-CM | POA: Insufficient documentation

## 2014-03-14 DIAGNOSIS — M79652 Pain in left thigh: Secondary | ICD-10-CM | POA: Insufficient documentation

## 2014-03-14 DIAGNOSIS — F05 Delirium due to known physiological condition: Secondary | ICD-10-CM

## 2014-03-14 NOTE — Telephone Encounter (Signed)
I called the patient.  The EEG study was unremarkable. 
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
## 2014-03-19 ENCOUNTER — Ambulatory Visit: Payer: Medicare Other | Admitting: Physical Therapy

## 2014-03-19 DIAGNOSIS — M25562 Pain in left knee: Secondary | ICD-10-CM | POA: Diagnosis not present

## 2014-03-19 DIAGNOSIS — M79652 Pain in left thigh: Secondary | ICD-10-CM | POA: Diagnosis not present

## 2014-03-19 DIAGNOSIS — R262 Difficulty in walking, not elsewhere classified: Secondary | ICD-10-CM | POA: Diagnosis not present

## 2014-03-21 ENCOUNTER — Ambulatory Visit: Payer: Medicare Other | Admitting: Physical Therapy

## 2014-03-21 ENCOUNTER — Encounter: Payer: Medicare Other | Admitting: Physical Therapy

## 2014-03-21 DIAGNOSIS — R262 Difficulty in walking, not elsewhere classified: Secondary | ICD-10-CM | POA: Diagnosis not present

## 2014-03-21 DIAGNOSIS — M25562 Pain in left knee: Secondary | ICD-10-CM | POA: Diagnosis not present

## 2014-03-21 DIAGNOSIS — M79652 Pain in left thigh: Secondary | ICD-10-CM | POA: Diagnosis not present

## 2014-03-25 ENCOUNTER — Ambulatory Visit: Payer: Medicare Other | Admitting: Physical Therapy

## 2014-03-26 ENCOUNTER — Ambulatory Visit: Payer: Medicare Other | Admitting: Physical Therapy

## 2014-03-26 DIAGNOSIS — M25562 Pain in left knee: Secondary | ICD-10-CM | POA: Diagnosis not present

## 2014-03-26 DIAGNOSIS — M79652 Pain in left thigh: Secondary | ICD-10-CM | POA: Diagnosis not present

## 2014-03-26 DIAGNOSIS — R262 Difficulty in walking, not elsewhere classified: Secondary | ICD-10-CM | POA: Diagnosis not present

## 2014-03-28 ENCOUNTER — Ambulatory Visit: Payer: Medicare Other | Admitting: Physical Therapy

## 2014-04-02 ENCOUNTER — Ambulatory Visit: Payer: Medicare Other | Attending: Neurosurgery | Admitting: Physical Therapy

## 2014-04-02 DIAGNOSIS — M79652 Pain in left thigh: Secondary | ICD-10-CM | POA: Insufficient documentation

## 2014-04-02 DIAGNOSIS — M25562 Pain in left knee: Secondary | ICD-10-CM | POA: Insufficient documentation

## 2014-04-02 DIAGNOSIS — R262 Difficulty in walking, not elsewhere classified: Secondary | ICD-10-CM | POA: Diagnosis not present

## 2014-04-04 ENCOUNTER — Ambulatory Visit: Payer: Medicare Other | Admitting: Physical Therapy

## 2014-04-09 ENCOUNTER — Ambulatory Visit: Payer: Medicare Other | Admitting: Physical Therapy

## 2014-04-09 DIAGNOSIS — R262 Difficulty in walking, not elsewhere classified: Secondary | ICD-10-CM | POA: Diagnosis not present

## 2014-04-09 DIAGNOSIS — M25562 Pain in left knee: Secondary | ICD-10-CM | POA: Diagnosis not present

## 2014-04-09 DIAGNOSIS — M79652 Pain in left thigh: Secondary | ICD-10-CM | POA: Diagnosis not present

## 2014-04-10 ENCOUNTER — Ambulatory Visit: Payer: Medicare Other | Admitting: Physical Therapy

## 2014-04-15 ENCOUNTER — Ambulatory Visit: Payer: Medicare Other | Admitting: Physical Therapy

## 2014-04-17 ENCOUNTER — Ambulatory Visit: Payer: Medicare Other | Admitting: Physical Therapy

## 2014-04-17 ENCOUNTER — Encounter: Payer: Self-pay | Admitting: Physical Therapy

## 2014-04-17 DIAGNOSIS — R262 Difficulty in walking, not elsewhere classified: Secondary | ICD-10-CM

## 2014-04-17 DIAGNOSIS — M25562 Pain in left knee: Secondary | ICD-10-CM

## 2014-04-17 DIAGNOSIS — M79652 Pain in left thigh: Secondary | ICD-10-CM | POA: Diagnosis not present
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
## 2014-04-23 ENCOUNTER — Ambulatory Visit: Payer: Medicare Other | Admitting: Physical Therapy

## 2014-04-25 ENCOUNTER — Ambulatory Visit: Payer: Medicare Other | Admitting: Physical Therapy

## 2014-04-30 DIAGNOSIS — M5136 Other intervertebral disc degeneration, lumbar region: Secondary | ICD-10-CM | POA: Diagnosis not present

## 2014-05-01 ENCOUNTER — Telehealth: Payer: Self-pay | Admitting: *Deleted

## 2014-05-01 NOTE — Telephone Encounter (Signed)
Pt states she needs a new prescription for her diuretic.

## 2014-05-02 NOTE — Telephone Encounter (Signed)
Donna Cummings, we have only seen this patient since we have been on Epic so she does not have a paper chart.

## 2014-05-02 NOTE — Telephone Encounter (Signed)
I don't prescibe diuretics

## 2014-05-02 NOTE — Telephone Encounter (Signed)
Dr. Mellody Drown recommendation to go to pt's primary for edema was called to pt.

## 2014-05-07 ENCOUNTER — Encounter: Payer: Self-pay | Admitting: Physical Therapy

## 2014-05-07 ENCOUNTER — Ambulatory Visit: Payer: Medicare Other | Attending: Neurosurgery | Admitting: Physical Therapy

## 2014-05-07 DIAGNOSIS — R262 Difficulty in walking, not elsewhere classified: Secondary | ICD-10-CM | POA: Insufficient documentation

## 2014-05-07 DIAGNOSIS — M25562 Pain in left knee: Secondary | ICD-10-CM | POA: Insufficient documentation

## 2014-05-07 DIAGNOSIS — M79652 Pain in left thigh: Secondary | ICD-10-CM | POA: Insufficient documentation
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
## 2014-05-09 ENCOUNTER — Ambulatory Visit: Payer: Medicare Other | Admitting: Physical Therapy

## 2014-05-09 ENCOUNTER — Encounter: Payer: Self-pay | Admitting: Physical Therapy

## 2014-05-09 DIAGNOSIS — M25562 Pain in left knee: Secondary | ICD-10-CM

## 2014-05-09 DIAGNOSIS — R262 Difficulty in walking, not elsewhere classified: Secondary | ICD-10-CM

## 2014-05-09 DIAGNOSIS — M79652 Pain in left thigh: Secondary | ICD-10-CM | POA: Diagnosis not present
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
## 2014-05-15 ENCOUNTER — Ambulatory Visit: Payer: Medicare Other | Admitting: Physical Therapy

## 2014-05-15 ENCOUNTER — Encounter: Payer: Self-pay | Admitting: Physical Therapy

## 2014-05-15 DIAGNOSIS — M25562 Pain in left knee: Secondary | ICD-10-CM | POA: Diagnosis not present

## 2014-05-15 DIAGNOSIS — M79652 Pain in left thigh: Secondary | ICD-10-CM | POA: Diagnosis not present

## 2014-05-15 DIAGNOSIS — R262 Difficulty in walking, not elsewhere classified: Secondary | ICD-10-CM
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
## 2014-05-17 ENCOUNTER — Ambulatory Visit: Payer: Medicare Other | Admitting: Physical Therapy

## 2014-05-20 ENCOUNTER — Encounter: Payer: Self-pay | Admitting: Physical Therapy

## 2014-05-20 ENCOUNTER — Ambulatory Visit: Payer: Medicare Other | Admitting: Physical Therapy

## 2014-05-20 DIAGNOSIS — M79652 Pain in left thigh: Secondary | ICD-10-CM | POA: Diagnosis not present

## 2014-05-20 DIAGNOSIS — M25562 Pain in left knee: Secondary | ICD-10-CM | POA: Diagnosis not present

## 2014-05-20 DIAGNOSIS — R262 Difficulty in walking, not elsewhere classified: Secondary | ICD-10-CM
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
## 2014-05-23 ENCOUNTER — Ambulatory Visit: Payer: Medicare Other | Admitting: Physical Therapy

## 2014-05-28 ENCOUNTER — Ambulatory Visit: Payer: Medicare Other | Admitting: Physical Therapy

## 2014-05-28 ENCOUNTER — Encounter: Payer: Self-pay | Admitting: Physical Therapy

## 2014-05-28 DIAGNOSIS — M25562 Pain in left knee: Secondary | ICD-10-CM | POA: Diagnosis not present

## 2014-05-28 DIAGNOSIS — R262 Difficulty in walking, not elsewhere classified: Secondary | ICD-10-CM

## 2014-05-28 DIAGNOSIS — M79652 Pain in left thigh: Secondary | ICD-10-CM | POA: Diagnosis not present
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
## 2014-05-29 ENCOUNTER — Encounter: Payer: BLUE CROSS/BLUE SHIELD | Admitting: Physical Therapy

## 2014-05-30 ENCOUNTER — Ambulatory Visit: Payer: Medicare Other | Admitting: Physical Therapy

## 2014-07-02 ENCOUNTER — Ambulatory Visit: Payer: Medicare Other | Attending: Neurosurgery | Admitting: Physical Therapy

## 2014-07-02 DIAGNOSIS — R262 Difficulty in walking, not elsewhere classified: Secondary | ICD-10-CM | POA: Insufficient documentation

## 2014-07-02 DIAGNOSIS — M79652 Pain in left thigh: Secondary | ICD-10-CM | POA: Insufficient documentation

## 2014-07-02 DIAGNOSIS — M25562 Pain in left knee: Secondary | ICD-10-CM | POA: Insufficient documentation

## 2014-07-12 ENCOUNTER — Ambulatory Visit: Payer: Medicare Other | Admitting: Physical Therapy

## 2014-07-24 ENCOUNTER — Ambulatory Visit: Payer: Medicare Other | Admitting: Physical Therapy

## 2014-07-24 ENCOUNTER — Encounter: Payer: Self-pay | Admitting: Physical Therapy

## 2014-07-24 DIAGNOSIS — M25562 Pain in left knee: Secondary | ICD-10-CM | POA: Diagnosis not present

## 2014-07-24 DIAGNOSIS — M545 Low back pain, unspecified: Secondary | ICD-10-CM

## 2014-07-24 DIAGNOSIS — R262 Difficulty in walking, not elsewhere classified: Secondary | ICD-10-CM | POA: Diagnosis not present

## 2014-07-24 DIAGNOSIS — M79652 Pain in left thigh: Secondary | ICD-10-CM | POA: Diagnosis not present
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
## 2014-07-31 ENCOUNTER — Encounter: Payer: Self-pay | Admitting: Physical Therapy

## 2014-07-31 ENCOUNTER — Ambulatory Visit: Payer: Medicare Other | Attending: Neurosurgery | Admitting: Physical Therapy

## 2014-07-31 DIAGNOSIS — M545 Low back pain, unspecified: Secondary | ICD-10-CM

## 2014-07-31 DIAGNOSIS — R262 Difficulty in walking, not elsewhere classified: Secondary | ICD-10-CM | POA: Insufficient documentation
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
## 2014-08-07 ENCOUNTER — Ambulatory Visit: Payer: Medicare Other | Admitting: Physical Therapy

## 2014-08-07 ENCOUNTER — Ambulatory Visit: Payer: Medicare Other

## 2014-08-07 ENCOUNTER — Encounter: Payer: Self-pay | Admitting: Physical Therapy

## 2014-08-07 DIAGNOSIS — M545 Low back pain: Secondary | ICD-10-CM | POA: Diagnosis not present

## 2014-08-07 DIAGNOSIS — R262 Difficulty in walking, not elsewhere classified: Secondary | ICD-10-CM
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
## 2014-08-16 DIAGNOSIS — R413 Other amnesia: Secondary | ICD-10-CM | POA: Diagnosis not present

## 2014-08-16 DIAGNOSIS — F418 Other specified anxiety disorders: Secondary | ICD-10-CM | POA: Diagnosis not present

## 2014-08-16 DIAGNOSIS — R5383 Other fatigue: Secondary | ICD-10-CM | POA: Diagnosis not present

## 2014-08-16 DIAGNOSIS — Z681 Body mass index (BMI) 19 or less, adult: Secondary | ICD-10-CM | POA: Diagnosis not present

## 2014-08-20 ENCOUNTER — Ambulatory Visit: Payer: Medicare Other | Admitting: Physical Therapy

## 2014-08-20 ENCOUNTER — Encounter: Payer: Self-pay | Admitting: Physical Therapy

## 2014-08-20 DIAGNOSIS — M545 Low back pain, unspecified: Secondary | ICD-10-CM

## 2014-08-20 DIAGNOSIS — R262 Difficulty in walking, not elsewhere classified: Secondary | ICD-10-CM | POA: Diagnosis not present
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
## 2014-08-28 ENCOUNTER — Ambulatory Visit: Payer: Medicare Other | Admitting: Physical Therapy

## 2014-09-05 ENCOUNTER — Encounter: Payer: Self-pay | Admitting: Internal Medicine

## 2014-09-23 DIAGNOSIS — H6123 Impacted cerumen, bilateral: Secondary | ICD-10-CM | POA: Diagnosis not present

## 2014-11-06 DIAGNOSIS — H2513 Age-related nuclear cataract, bilateral: Secondary | ICD-10-CM | POA: Diagnosis not present

## 2014-11-29 DIAGNOSIS — D539 Nutritional anemia, unspecified: Secondary | ICD-10-CM | POA: Diagnosis not present

## 2014-12-02 DIAGNOSIS — Z0000 Encounter for general adult medical examination without abnormal findings: Secondary | ICD-10-CM | POA: Diagnosis not present

## 2014-12-02 DIAGNOSIS — K589 Irritable bowel syndrome without diarrhea: Secondary | ICD-10-CM | POA: Diagnosis not present

## 2014-12-02 DIAGNOSIS — E785 Hyperlipidemia, unspecified: Secondary | ICD-10-CM | POA: Diagnosis not present

## 2014-12-04 DIAGNOSIS — K219 Gastro-esophageal reflux disease without esophagitis: Secondary | ICD-10-CM | POA: Diagnosis not present

## 2014-12-04 DIAGNOSIS — Z23 Encounter for immunization: Secondary | ICD-10-CM | POA: Diagnosis not present

## 2014-12-04 DIAGNOSIS — R609 Edema, unspecified: Secondary | ICD-10-CM | POA: Diagnosis not present

## 2014-12-04 DIAGNOSIS — Z681 Body mass index (BMI) 19 or less, adult: Secondary | ICD-10-CM | POA: Diagnosis not present

## 2014-12-04 DIAGNOSIS — Z1389 Encounter for screening for other disorder: Secondary | ICD-10-CM | POA: Diagnosis not present

## 2014-12-04 DIAGNOSIS — I6529 Occlusion and stenosis of unspecified carotid artery: Secondary | ICD-10-CM | POA: Diagnosis not present

## 2014-12-04 DIAGNOSIS — I638 Other cerebral infarction: Secondary | ICD-10-CM | POA: Diagnosis not present

## 2014-12-04 DIAGNOSIS — D126 Benign neoplasm of colon, unspecified: Secondary | ICD-10-CM | POA: Diagnosis not present

## 2014-12-04 DIAGNOSIS — F418 Other specified anxiety disorders: Secondary | ICD-10-CM | POA: Diagnosis not present

## 2014-12-04 DIAGNOSIS — Z0000 Encounter for general adult medical examination without abnormal findings: Secondary | ICD-10-CM | POA: Diagnosis not present

## 2014-12-04 DIAGNOSIS — R413 Other amnesia: Secondary | ICD-10-CM | POA: Diagnosis not present

## 2014-12-04 DIAGNOSIS — D539 Nutritional anemia, unspecified: Secondary | ICD-10-CM | POA: Diagnosis not present

## 2015-02-13 ENCOUNTER — Ambulatory Visit (HOSPITAL_COMMUNITY)
Admission: RE | Admit: 2015-02-13 | Discharge: 2015-02-13 | Disposition: A | Discharge status: Home / Self Care | Payer: Medicare Other | Source: Ambulatory Visit | Attending: Internal Medicine | Admitting: Internal Medicine

## 2015-03-05 ENCOUNTER — Ambulatory Visit (HOSPITAL_COMMUNITY): Admission: RE | Admit: 2015-03-05 | Payer: Medicare Other | Source: Ambulatory Visit

## 2015-03-05 ENCOUNTER — Other Ambulatory Visit (HOSPITAL_COMMUNITY): Payer: Self-pay | Admitting: Internal Medicine

## 2015-03-17 ENCOUNTER — Emergency Department (HOSPITAL_BASED_OUTPATIENT_CLINIC_OR_DEPARTMENT_OTHER): Payer: Medicare Other

## 2015-03-17 ENCOUNTER — Emergency Department (HOSPITAL_BASED_OUTPATIENT_CLINIC_OR_DEPARTMENT_OTHER)
Admission: EM | Admit: 2015-03-17 | Discharge: 2015-03-17 | Disposition: A | Discharge status: Home / Self Care | Payer: Medicare Other | Attending: Emergency Medicine | Admitting: Emergency Medicine

## 2015-03-17 ENCOUNTER — Encounter (HOSPITAL_BASED_OUTPATIENT_CLINIC_OR_DEPARTMENT_OTHER): Payer: Self-pay | Admitting: *Deleted

## 2015-03-17 DIAGNOSIS — Z85828 Personal history of other malignant neoplasm of skin: Secondary | ICD-10-CM | POA: Insufficient documentation

## 2015-03-17 DIAGNOSIS — Y998 Other external cause status: Secondary | ICD-10-CM | POA: Insufficient documentation

## 2015-03-17 DIAGNOSIS — S0990XA Unspecified injury of head, initial encounter: Secondary | ICD-10-CM | POA: Insufficient documentation

## 2015-03-17 DIAGNOSIS — S0083XA Contusion of other part of head, initial encounter: Secondary | ICD-10-CM

## 2015-03-17 DIAGNOSIS — Y92480 Sidewalk as the place of occurrence of the external cause: Secondary | ICD-10-CM | POA: Insufficient documentation

## 2015-03-17 DIAGNOSIS — Z79899 Other long term (current) drug therapy: Secondary | ICD-10-CM | POA: Insufficient documentation

## 2015-03-17 DIAGNOSIS — W010XXA Fall on same level from slipping, tripping and stumbling without subsequent striking against object, initial encounter: Secondary | ICD-10-CM | POA: Insufficient documentation

## 2015-03-17 DIAGNOSIS — S01112A Laceration without foreign body of left eyelid and periocular area, initial encounter: Secondary | ICD-10-CM

## 2015-03-17 DIAGNOSIS — Z87891 Personal history of nicotine dependence: Secondary | ICD-10-CM | POA: Insufficient documentation

## 2015-03-17 DIAGNOSIS — S0993XA Unspecified injury of face, initial encounter: Secondary | ICD-10-CM | POA: Diagnosis present

## 2015-03-17 DIAGNOSIS — Y9301 Activity, walking, marching and hiking: Secondary | ICD-10-CM | POA: Insufficient documentation

## 2015-03-17 DIAGNOSIS — Z86010 Personal history of colonic polyps: Secondary | ICD-10-CM | POA: Diagnosis not present

## 2015-03-17 DIAGNOSIS — F329 Major depressive disorder, single episode, unspecified: Secondary | ICD-10-CM | POA: Diagnosis not present

## 2015-03-17 DIAGNOSIS — Z880 Allergy status to penicillin: Secondary | ICD-10-CM | POA: Insufficient documentation

## 2015-03-17 DIAGNOSIS — Z8673 Personal history of transient ischemic attack (TIA), and cerebral infarction without residual deficits: Secondary | ICD-10-CM | POA: Diagnosis not present

## 2015-03-17 DIAGNOSIS — M1990 Unspecified osteoarthritis, unspecified site: Secondary | ICD-10-CM | POA: Insufficient documentation

## 2015-03-17 DIAGNOSIS — Z8719 Personal history of other diseases of the digestive system: Secondary | ICD-10-CM | POA: Insufficient documentation

## 2015-03-17 DIAGNOSIS — Z7982 Long term (current) use of aspirin: Secondary | ICD-10-CM | POA: Diagnosis not present

## 2015-03-17 NOTE — ED Provider Notes (Signed)
CSN: [id number]     Arrival date & time 03/17/15  0949 History   First MD Initiated Contact with Patient 03/17/15 [phone]     No chief complaint on file.    (Consider location/radiation/quality/duration/timing/severity/associated sxs/prior Treatment) HPI Comments: Patient is an 82 year old female with a history of TIA, gastritis and GERD who presents today after a mechanical fall. She was carrying multiple things in her hands walking along the sidewalk and tripped falling forward landing on the left side of her face. She denies LOC but is complaining of a headache and facial pain. She sustained a cut above her left eye. She denies any change in her vision, neck pain, chest or abdominal pain. She states her left leg feels sore but she is able to ambulate without difficulty. She is sure that her tetanus shot is up-to-date  The history is provided by the patient.    Past Medical History  Diagnosis Date  . Colon polyps     Adenomatus and Hyperplastic  . Internal hemorrhoids   . Gastritis   . Arthritis   . TIA (transient ischemic attack)   . Irritable bowel   . Reflux esophagitis 2011  . Gastric ulcer     multiple linear ulcers in body of stomach  . Gastric polyps   . Antritis (stomach) 2003    erosive  . Allergy   . Blood transfusion   . Cancer ([place])     skin  . GERD (gastroesophageal reflux disease)   . Stroke ([place])   . Depression    Past Surgical History  Procedure Laterality Date  . Colonoscopy  '88,'89,'93,'98'05    Dierdre Searles MD, [facility]  . Esophagogastroduodenoscopy  '02,'03,'05    Dierdre Searles MD, [facility]  . Appendectomy    . Total hip arthroplasty  '00    bilateral  . Back surgery  05/14/2013   Family History  Problem Relation Age of Onset  . Colon polyps Mother   . Breast cancer Sister   . Heart disease Father   . Diabetes Neg Hx    Social History  Substance Use Topics  . Smoking status: Former Research scientist (life sciences)  . Smokeless tobacco: Never Used    . Alcohol Use: Yes     Comment: 2 glasses a day, occ martini or scotch   OB History    No data available     Review of Systems  All other systems reviewed and are negative.     Allergies  Penicillins  Home Medications   Prior to Admission medications   Medication Sig Start Date End Date Taking? Authorizing Provider  aspirin 81 MG tablet Take 81 mg by mouth daily.   Yes Historical Provider, MD  cholecalciferol (VITAMIN D) 1000 UNITS tablet Take 2,000 Units by mouth daily.    Yes Historical Provider, MD  fish oil-omega-3 fatty acids 1000 MG capsule Take 1 g by mouth daily.   Yes Historical Provider, MD  methylcellulose (ARTIFICIAL TEARS) 1 % ophthalmic solution Place 1 drop into both eyes as needed. Dry eyes.   Yes Historical Provider, MD  Misc Natural Products (NARCOSOFT HERBAL LAX PO) Take 1-2 tablets by mouth at bedtime as needed. constipation   Yes Historical Provider, MD  Multiple Vitamin (MULTIVITAMIN WITH MINERALS) TABS tablet Take 1 tablet by mouth daily.   Yes Historical Provider, MD  Probiotic Product ([place]) Take 1 capsule by mouth daily.    Yes Historical Provider, MD  escitalopram (LEXAPRO) 10 MG tablet  Take 5 mg by mouth daily.    Historical Provider, MD   BP 137/64 mmHg  Pulse 57  Temp(Src) 98.1 F (36.7 C) (Oral)  Resp 18  Ht 5\' 3"  (1.6 m)  Wt 95 lb (43.092 kg)  BMI 16.83 kg/m2  SpO2 100% Physical Exam  Constitutional: She is oriented to person, place, and time. She appears well-developed and well-nourished. No distress.  HENT:  Head: Normocephalic. Head is with laceration.    Right Ear: Tympanic membrane normal.  Left Ear: Tympanic membrane normal.  Mouth/Throat: Oropharynx is clear and moist.  Eyes: Conjunctivae and EOM are normal. Pupils are equal, round, and reactive to light.  Neck: Normal range of motion. Neck supple. No spinous process tenderness and no muscular tenderness present.  Cardiovascular: Normal rate, regular rhythm  and intact distal pulses.   No murmur heard. Pulmonary/Chest: Effort normal and breath sounds normal. No respiratory distress. She has no wheezes. She has no rales.  Abdominal: Soft. She exhibits no distension. There is no tenderness. There is no rebound and no guarding.  Musculoskeletal: Normal range of motion. She exhibits no edema or tenderness.  Neurological: She is alert and oriented to person, place, and time.  Skin: Skin is warm and dry. No rash noted. No erythema.  Psychiatric: She has a normal mood and affect. Her behavior is normal.  Nursing note and vitals reviewed.   ED Course  Procedures (including critical care time) Labs Review Labs Reviewed - No data to display  Imaging Review Ct Head Wo Contrast  03/17/2015  CLINICAL DATA:  Status post fall onto concrete today with lacerations above the left eye and on the chin. Initial encounter. EXAM: CT HEAD WITHOUT CONTRAST CT MAXILLOFACIAL WITHOUT CONTRAST TECHNIQUE: Multidetector CT imaging of the head and maxillofacial structures were performed using the standard protocol without intravenous contrast. Multiplanar CT image reconstructions of the maxillofacial structures were also generated. COMPARISON:  Head and maxillofacial CT scan 03/08/2014. FINDINGS: CT HEAD FINDINGS There is cortical atrophy and chronic microvascular ischemic change. No evidence of acute intracranial abnormality including hemorrhage, infarct, mass lesion, mass effect, midline shift or abnormal extra-axial fluid collection. Small left mastoid effusion is chronic. Right mastoid air cells and imaged paranasal sinuses are clear. No hydrocephalus or pneumocephalus. The calvarium is intact. CT MAXILLOFACIAL FINDINGS No facial bone fracture is identified. The globes are intact and the lenses are located. Orbital fat is clear. Visualized cervical spine demonstrates reversal of lordosis and multilevel spondylosis. IMPRESSION: No acute abnormality head or cervical spine. Atrophy  and chronic microvascular ischemic change. Cervical spondylosis. Electronically Signed   By: Inge Rise M.D.   On: 03/17/2015 10:55   Ct Maxillofacial Wo Cm  03/17/2015  CLINICAL DATA:  Status post fall onto concrete today with lacerations above the left eye and on the chin. Initial encounter. EXAM: CT HEAD WITHOUT CONTRAST CT MAXILLOFACIAL WITHOUT CONTRAST TECHNIQUE: Multidetector CT imaging of the head and maxillofacial structures were performed using the standard protocol without intravenous contrast. Multiplanar CT image reconstructions of the maxillofacial structures were also generated. COMPARISON:  Head and maxillofacial CT scan 03/08/2014. FINDINGS: CT HEAD FINDINGS There is cortical atrophy and chronic microvascular ischemic change. No evidence of acute intracranial abnormality including hemorrhage, infarct, mass lesion, mass effect, midline shift or abnormal extra-axial fluid collection. Small left mastoid effusion is chronic. Right mastoid air cells and imaged paranasal sinuses are clear. No hydrocephalus or pneumocephalus. The calvarium is intact. CT MAXILLOFACIAL FINDINGS No facial bone fracture is identified. The globes are  intact and the lenses are located. Orbital fat is clear. Visualized cervical spine demonstrates reversal of lordosis and multilevel spondylosis. IMPRESSION: No acute abnormality head or cervical spine. Atrophy and chronic microvascular ischemic change. Cervical spondylosis. Electronically Signed   By: Inge Rise M.D.   On: 03/17/2015 10:55   I have personally reviewed and evaluated these images and lab results as part of my medical decision-making.  LACERATION REPAIR Performed by: Blanchie Dessert Authorized byBlanchie Dessert Consent: Verbal consent obtained. Risks and benefits: risks, benefits and alternatives were discussed Consent given by: patient Patient identity confirmed: provided demographic data Prepped and Draped in normal sterile  fashion Wound explored  Laceration Location: left eyebrow  Laceration Length: 2cm  No Foreign Bodies seen or palpated  Anesthesia: none Irrigation method: scrub Amount of cleaning: standard  Skin closure: dermabond  Patient tolerance: Patient tolerated the procedure well with no immediate complications.   MDM   Final diagnoses:  Facial contusion, initial encounter  Eyebrow laceration, left, initial encounter    Patient presenting with a mechanical fall with injury to the left side of the face and complains of a headache. She denies any loss of consciousness and takes no anticoagulation other than an 81 mg aspirin. Vision is normal and extraocular movements are intact. Wound was repaired with Dermabond.  CT of the head and face pending. Patient was able to ambulate here without difficulty and no significant tenderness in the upper or lower extremities  Imaging is neg and pt d/ced home.   Blanchie Dessert, MD 03/17/15 1601

## 2015-03-17 NOTE — Discharge Instructions (Signed)
Facial Laceration ° A facial laceration is a cut on the face. These injuries can be painful and cause bleeding. Lacerations usually heal quickly, but they need special care to reduce scarring. °DIAGNOSIS  °Your health care provider will take a medical history, ask for details about how the injury occurred, and examine the wound to determine how deep the cut is. °TREATMENT  °Some facial lacerations may not require closure. Others may not be able to be closed because of an increased risk of infection. The risk of infection and the chance for successful closure will depend on various factors, including the amount of time since the injury occurred. °The wound may be cleaned to help prevent infection. If closure is appropriate, pain medicines may be given if needed. Your health care provider will use stitches (sutures), wound glue (adhesive), or skin adhesive strips to repair the laceration. These tools bring the skin edges together to allow for faster healing and a better cosmetic outcome. If needed, you may also be given a tetanus shot. °HOME CARE INSTRUCTIONS °· Only take over-the-counter or prescription medicines as directed by your health care provider. °· Follow your health care provider's instructions for wound care. These instructions will vary depending on the technique used for closing the wound. °For Sutures: °· Keep the wound clean and dry.   °· If you were given a bandage (dressing), you should change it at least once a day. Also change the dressing if it becomes wet or dirty, or as directed by your health care provider.   °· Wash the wound with soap and water 2 times a day. Rinse the wound off with water to remove all soap. Pat the wound dry with a clean towel.   °· After cleaning, apply a thin layer of the antibiotic ointment recommended by your health care provider. This will help prevent infection and keep the dressing from sticking.   °· You may shower as usual after the first 24 hours. Do not soak the  wound in water until the sutures are removed.   °· Get your sutures removed as directed by your health care provider. With facial lacerations, sutures should usually be taken out after 4-5 days to avoid stitch marks.   °· Wait a few days after your sutures are removed before applying any makeup. °For Skin Adhesive Strips: °· Keep the wound clean and dry.   °· Do not get the skin adhesive strips wet. You may bathe carefully, using caution to keep the wound dry.   °· If the wound gets wet, pat it dry with a clean towel.   °· Skin adhesive strips will fall off on their own. You may trim the strips as the wound heals. Do not remove skin adhesive strips that are still stuck to the wound. They will fall off in time.   °For Wound Adhesive: °· You may briefly wet your wound in the shower or bath. Do not soak or scrub the wound. Do not swim. Avoid periods of heavy sweating until the skin adhesive has fallen off on its own. After showering or bathing, gently pat the wound dry with a clean towel.   °· Do not apply liquid medicine, cream medicine, ointment medicine, or makeup to your wound while the skin adhesive is in place. This may loosen the film before your wound is healed.   °· If a dressing is placed over the wound, be careful not to apply tape directly over the skin adhesive. This may cause the adhesive to be pulled off before the wound is healed.   °· Avoid   prolonged exposure to sunlight or tanning lamps while the skin adhesive is in place.  The skin adhesive will usually remain in place for 5-10 days, then naturally fall off the skin. Do not pick at the adhesive film.  After Healing: Once the wound has healed, cover the wound with sunscreen during the day for 1 full year. This can help minimize scarring. Exposure to ultraviolet light in the first year will darken the scar. It can take 1-2 years for the scar to lose its redness and to heal completely.  SEEK MEDICAL CARE IF:  You have a fever. SEEK IMMEDIATE  MEDICAL CARE IF:  You have redness, pain, or swelling around the wound.   You see ayellowish-white fluid (pus) coming from the wound.    This information is not intended to replace advice given to you by your health care provider. Make sure you discuss any questions you have with your health care provider.   Document Released: 03/25/2004 Document Revised: 03/08/2014 Document Reviewed: 09/28/2012 Elsevier Interactive Patient Education 2016 [place] A contusion is a deep bruise. Contusions happen when an injury causes bleeding under the skin. Symptoms of bruising include pain, swelling, and discolored skin. The skin may turn blue, purple, or yellow. HOME CARE   Rest the injured area.  If told, put ice on the injured area.  Put ice in a plastic bag.  Place a towel between your skin and the bag.  Leave the ice on for 20 minutes, 2-3 times per day.  If told, put light pressure (compression) on the injured area using an elastic bandage. Make sure the bandage is not too tight. Remove it and put it back on as told by your doctor.  If possible, raise (elevate) the injured area above the level of your heart while you are sitting or lying down.  Take over-the-counter and prescription medicines only as told by your doctor. GET HELP IF:  Your symptoms do not get better after several days of treatment.  Your symptoms get worse.  You have trouble moving the injured area. GET HELP RIGHT AWAY IF:   You have very bad pain.  You have a loss of feeling (numbness) in a hand or foot.  Your hand or foot turns pale or cold.   This information is not intended to replace advice given to you by your health care provider. Make sure you discuss any questions you have with your health care provider.   Document Released: 08/04/2007 Document Revised: 11/06/2014 Document Reviewed: 07/03/2014 Elsevier Interactive Patient Education [employer].

## 2015-03-17 NOTE — ED Notes (Signed)
Pt given ice pack for home use. D/c with family

## 2015-03-17 NOTE — ED Notes (Signed)
States she tripped and fell face first onto cement walkway. No LOC. Has abrasions to face on left and around eye. Teeth chipped and pain in left upper leg.

## 2015-04-11 DIAGNOSIS — F4323 Adjustment disorder with mixed anxiety and depressed mood: Secondary | ICD-10-CM | POA: Diagnosis not present

## 2015-04-11 DIAGNOSIS — F0390 Unspecified dementia without behavioral disturbance: Secondary | ICD-10-CM | POA: Diagnosis not present

## 2015-05-01 ENCOUNTER — Ambulatory Visit: Payer: Medicare Other | Attending: Internal Medicine | Admitting: Physical Therapy

## 2015-05-01 DIAGNOSIS — F4323 Adjustment disorder with mixed anxiety and depressed mood: Secondary | ICD-10-CM | POA: Diagnosis not present

## 2015-05-01 DIAGNOSIS — F0390 Unspecified dementia without behavioral disturbance: Secondary | ICD-10-CM | POA: Diagnosis not present

## 2015-05-02 DIAGNOSIS — F0390 Unspecified dementia without behavioral disturbance: Secondary | ICD-10-CM | POA: Diagnosis not present

## 2015-05-02 DIAGNOSIS — F4323 Adjustment disorder with mixed anxiety and depressed mood: Secondary | ICD-10-CM | POA: Diagnosis not present

## 2015-05-07 DIAGNOSIS — Z1389 Encounter for screening for other disorder: Secondary | ICD-10-CM | POA: Diagnosis not present

## 2015-05-07 DIAGNOSIS — F418 Other specified anxiety disorders: Secondary | ICD-10-CM | POA: Diagnosis not present

## 2015-05-07 DIAGNOSIS — R413 Other amnesia: Secondary | ICD-10-CM | POA: Diagnosis not present

## 2015-05-07 DIAGNOSIS — Z681 Body mass index (BMI) 19 or less, adult: Secondary | ICD-10-CM | POA: Diagnosis not present

## 2015-05-07 DIAGNOSIS — M79646 Pain in unspecified finger(s): Secondary | ICD-10-CM | POA: Diagnosis not present

## 2015-06-04 DIAGNOSIS — R52 Pain, unspecified: Secondary | ICD-10-CM | POA: Diagnosis not present

## 2015-06-04 DIAGNOSIS — M19041 Primary osteoarthritis, right hand: Secondary | ICD-10-CM | POA: Diagnosis not present

## 2015-07-03 DIAGNOSIS — D485 Neoplasm of uncertain behavior of skin: Secondary | ICD-10-CM | POA: Diagnosis not present

## 2015-07-03 DIAGNOSIS — L308 Other specified dermatitis: Secondary | ICD-10-CM | POA: Diagnosis not present

## 2015-08-07 DIAGNOSIS — L308 Other specified dermatitis: Secondary | ICD-10-CM | POA: Diagnosis not present

## 2015-08-07 DIAGNOSIS — C44519 Basal cell carcinoma of skin of other part of trunk: Secondary | ICD-10-CM | POA: Diagnosis not present

## 2015-08-07 DIAGNOSIS — D485 Neoplasm of uncertain behavior of skin: Secondary | ICD-10-CM | POA: Diagnosis not present

## 2015-08-14 ENCOUNTER — Encounter (HOSPITAL_BASED_OUTPATIENT_CLINIC_OR_DEPARTMENT_OTHER): Payer: Self-pay | Admitting: *Deleted

## 2015-08-14 ENCOUNTER — Emergency Department (HOSPITAL_BASED_OUTPATIENT_CLINIC_OR_DEPARTMENT_OTHER)
Admission: EM | Admit: 2015-08-14 | Discharge: 2015-08-14 | Disposition: A | Discharge status: Home / Self Care | Payer: Medicare Other | Attending: Emergency Medicine | Admitting: Emergency Medicine

## 2015-08-14 DIAGNOSIS — Z8673 Personal history of transient ischemic attack (TIA), and cerebral infarction without residual deficits: Secondary | ICD-10-CM | POA: Diagnosis not present

## 2015-08-14 DIAGNOSIS — Z7982 Long term (current) use of aspirin: Secondary | ICD-10-CM | POA: Insufficient documentation

## 2015-08-14 DIAGNOSIS — Y929 Unspecified place or not applicable: Secondary | ICD-10-CM | POA: Insufficient documentation

## 2015-08-14 DIAGNOSIS — Y939 Activity, unspecified: Secondary | ICD-10-CM | POA: Diagnosis not present

## 2015-08-14 DIAGNOSIS — Z79899 Other long term (current) drug therapy: Secondary | ICD-10-CM | POA: Insufficient documentation

## 2015-08-14 DIAGNOSIS — W228XXA Striking against or struck by other objects, initial encounter: Secondary | ICD-10-CM | POA: Insufficient documentation

## 2015-08-14 DIAGNOSIS — Y999 Unspecified external cause status: Secondary | ICD-10-CM | POA: Diagnosis not present

## 2015-08-14 DIAGNOSIS — Z87891 Personal history of nicotine dependence: Secondary | ICD-10-CM | POA: Diagnosis not present

## 2015-08-14 DIAGNOSIS — S81811A Laceration without foreign body, right lower leg, initial encounter: Secondary | ICD-10-CM

## 2015-08-14 DIAGNOSIS — C4490 Unspecified malignant neoplasm of skin, unspecified: Secondary | ICD-10-CM | POA: Diagnosis not present

## 2015-08-14 DIAGNOSIS — S8991XA Unspecified injury of right lower leg, initial encounter: Secondary | ICD-10-CM | POA: Diagnosis present

## 2015-08-14 DIAGNOSIS — F329 Major depressive disorder, single episode, unspecified: Secondary | ICD-10-CM | POA: Diagnosis not present

## 2015-08-14 MED ORDER — LIDOCAINE-EPINEPHRINE-TETRACAINE (LET) SOLUTION
3.0000 mL | Freq: Once | NASAL | Status: AC
  Administered 2015-08-14: 3 mL via TOPICAL
  Filled 2015-08-14: qty 3

## 2015-08-14 NOTE — ED Notes (Signed)
Skin tear to her right lower leg 2 days ago. Someone ran into her with a shopping cart.

## 2015-08-14 NOTE — ED Notes (Signed)
Right lower leg skin tear.

## 2015-08-14 NOTE — ED Provider Notes (Signed)
CSN: [id number]     Arrival date & time 08/14/15  1117 History   First MD Initiated Contact with Patient 08/14/15 1142     Chief Complaint  Patient presents with  . Leg Injury     (Consider location/radiation/quality/duration/timing/severity/associated sxs/prior Treatment) Patient is a 82 y.o. female presenting with leg pain. The history is provided by the patient. No language interpreter was used.  Leg Pain Location:  Leg Injury: yes   Mechanism of injury comment:  Direct blow Leg location:  R leg Pain details:    Quality:  Aching   Radiates to:  Does not radiate   Severity:  Mild   Onset quality:  Gradual   Duration:  2 days   Timing:  Constant   Progression:  Worsening Chronicity:  New Dislocation: no   Foreign body present:  No foreign bodies Tetanus status:  Up to date Prior injury to area:  No Worsened by:  Nothing tried Ineffective treatments:  None tried Risk factors: no recent illness   Pt has a skin tear to her right lower leg, Pt injured two days ago.  Pt reports there is a rolled up area of skin that she thinks needs to be trimmed off.   Past Medical History  Diagnosis Date  . Colon polyps     Adenomatus and Hyperplastic  . Internal hemorrhoids   . Gastritis   . Arthritis   . TIA (transient ischemic attack)   . Irritable bowel   . Reflux esophagitis 2011  . Gastric ulcer     multiple linear ulcers in body of stomach  . Gastric polyps   . Antritis (stomach) 2003    erosive  . Allergy   . Blood transfusion   . Cancer ([place])     skin  . GERD (gastroesophageal reflux disease)   . Stroke ([place])   . Depression    Past Surgical History  Procedure Laterality Date  . Colonoscopy  '88,'89,'93,'98'05    Dierdre Searles MD, [facility]  . Esophagogastroduodenoscopy  '02,'03,'05    Dierdre Searles MD, [facility]  . Appendectomy    . Total hip arthroplasty  '00    bilateral  . Back surgery  05/14/2013   Family History  Problem Relation Age of  Onset  . Colon polyps Mother   . Breast cancer Sister   . Heart disease Father   . Diabetes Neg Hx    Social History  Substance Use Topics  . Smoking status: Former Research scientist (life sciences)  . Smokeless tobacco: Never Used  . Alcohol Use: Yes     Comment: 2 glasses a day, occ martini or scotch   OB History    No data available     Review of Systems  All other systems reviewed and are negative.     Allergies  Penicillins  Home Medications   Prior to Admission medications   Medication Sig Start Date End Date Taking? Authorizing Provider  aspirin 81 MG tablet Take 81 mg by mouth daily.    Historical Provider, MD  cholecalciferol (VITAMIN D) 1000 UNITS tablet Take 2,000 Units by mouth daily.     Historical Provider, MD  escitalopram (LEXAPRO) 10 MG tablet Take 5 mg by mouth daily.    Historical Provider, MD  fish oil-omega-3 fatty acids 1000 MG capsule Take 1 g by mouth daily.    Historical Provider, MD  methylcellulose (ARTIFICIAL TEARS) 1 % ophthalmic solution Place 1 drop into both eyes as needed. Dry eyes.  Historical Provider, MD  Misc Natural Products (NARCOSOFT HERBAL LAX PO) Take 1-2 tablets by mouth at bedtime as needed. constipation    Historical Provider, MD  Multiple Vitamin (MULTIVITAMIN WITH MINERALS) TABS tablet Take 1 tablet by mouth daily.    Historical Provider, MD  Probiotic Product ([place]) Take 1 capsule by mouth daily.     Historical Provider, MD   BP 110/53 mmHg  Pulse 108  Temp(Src) 98.2 F (36.8 C) (Oral)  Resp 20  Ht 5\' 3"  (1.6 m)  Wt 36.288 kg  BMI 14.18 kg/m2  SpO2 96% Physical Exam  Constitutional: She is oriented to person, place, and time. She appears well-developed and well-nourished.  HENT:  Head: Normocephalic.  Eyes: EOM are normal.  Neck: Normal range of motion.  Pulmonary/Chest: Effort normal.  Abdominal: She exhibits no distension.  Musculoskeletal: Normal range of motion.  4cm superficial skin tear left lower leg. Small  dried flap of skin  Tender to touch  Neurological: She is alert and oriented to person, place, and time.  Psychiatric: She has a normal mood and affect.  Nursing note and vitals reviewed.   ED Course  Procedures (including critical care time) Labs Review Labs Reviewed - No data to display  Imaging Review No results found. I have personally reviewed and evaluated these images and lab results as part of my medical decision-making.   EKG Interpretation None      MDM  Procedure  LET to wound x 20 minutes,   Betadine prep,   I debrieded small flap of skin.  Rn cleaned and bandaged.  Pt advised to see her MD for recheck next week for recheck   Final diagnoses:  Laceration of right leg excluding thigh, initial encounter   (Pt was inappropriate with nursing staff, reporting wait was to long and that they were not caring for her appropriately)  I attempted to counsel/explain and reassure.   Wound care counseling  An After Visit Summary was printed and given to the patient.    Hollace Kinnier [place], PA-C 08/14/15 1321  Blanchie Dessert, MD 08/14/15 [phone]

## 2015-08-14 NOTE — ED Notes (Signed)
Wound care completed, pt instructed on dressing changes and cleansing.

## 2015-08-14 NOTE — Discharge Instructions (Signed)
Nonsutured Laceration Care °A laceration is a cut that goes through all layers of the skin and extends into the tissue that is right under the skin. This type of cut is usually stitched up (sutured) or closed with tape (adhesive strips) or skin glue shortly after the injury happens. °However, if the wound is dirty or if several hours pass before medical treatment is provided, it is likely that germs (bacteria) will enter the wound. Closing a laceration after bacteria have entered it increases the risk of infection. In these cases, your health care provider may leave the laceration open (nonsutured) and cover it with a bandage. This type of treatment helps prevent infection and allows the wound to heal from the deepest layer of tissue damage up to the surface. °An open fracture is a type of injury that may involve nonsutured lacerations. An open fracture is a break in a bone that happens along with one or more lacerations through the skin that is near the fracture site. °HOW TO CARE FOR YOUR NONSUTURED LACERATION °· Take or apply over-the-counter and prescription medicines only as told by your health care provider. °· If you were prescribed an antibiotic medicine, take or apply it as told by your health care provider. Do not stop using the antibiotic even if your condition improves. °· Clean the wound one time each day or as told by your health care provider. °¨ Wash the wound with mild soap and water. °¨ Rinse the wound with water to remove all soap. °¨ Pat your wound dry with a clean towel. Do not rub the wound. °· Do not inject anything into the wound unless your health care provider told you to. °· Change any bandages (dressings) as told by your health care provider. This includes changing the dressing if it gets wet, dirty, or starts to smell bad. °· Keep the dressing dry until your health care provider says it can be removed. Do not take baths, swim, or do anything that puts your wound underwater until your  health care provider approves. °· Raise (elevate) the injured area above the level of your heart while you are sitting or lying down, if possible. °· Do not scratch or pick at the wound. °· Check your wound every day for signs of infection. Watch for: °¨ Redness, swelling, or pain. °¨ Fluid, blood, or pus. °· Keep all follow-up visits as told by your health care provider. This is important. °SEEK MEDICAL CARE IF: °· You received a tetanus and shot and you have swelling, severe pain, redness, or bleeding at the injection site.   °· You have a fever. °· Your pain is not controlled with medicine. °· You have increased redness, swelling, or pain at the site of your wound. °· You have fluid, blood, or pus coming from your wound. °· You notice a bad smell coming from your wound or your dressing. °· You notice something coming out of the wound, such as wood or glass. °· You notice a change in the color of your skin near your wound. °· You develop a new rash. °· You need to change the dressing frequently due to fluid, blood, or pus draining from the wound. °· You develop numbness around your wound. °SEEK IMMEDIATE MEDICAL CARE IF: °· Your pain suddenly increases and is severe. °· You develop severe swelling around the wound. °· The wound is on your hand or foot and you cannot properly move a finger or toe. °· The wound is on your hand or   foot and you notice that your fingers or toes look pale or bluish. °· You have a red streak going away from your wound. °  °This information is not intended to replace advice given to you by your health care provider. Make sure you discuss any questions you have with your health care provider. °  °Document Released: 01/13/2006 Document Revised: 07/02/2014 Document Reviewed: 02/11/2014 °Elsevier Interactive Patient Education ©2016 Elsevier Inc. ° °

## 2015-08-18 ENCOUNTER — Encounter (HOSPITAL_BASED_OUTPATIENT_CLINIC_OR_DEPARTMENT_OTHER): Payer: Self-pay | Admitting: Emergency Medicine

## 2015-08-18 ENCOUNTER — Emergency Department (HOSPITAL_BASED_OUTPATIENT_CLINIC_OR_DEPARTMENT_OTHER)
Admission: EM | Admit: 2015-08-18 | Discharge: 2015-08-18 | Disposition: A | Discharge status: Home / Self Care | Payer: Medicare Other | Attending: Emergency Medicine | Admitting: Emergency Medicine

## 2015-08-18 DIAGNOSIS — L089 Local infection of the skin and subcutaneous tissue, unspecified: Secondary | ICD-10-CM | POA: Diagnosis not present

## 2015-08-18 DIAGNOSIS — T148XXA Other injury of unspecified body region, initial encounter: Secondary | ICD-10-CM

## 2015-08-18 DIAGNOSIS — Z4800 Encounter for change or removal of nonsurgical wound dressing: Secondary | ICD-10-CM | POA: Diagnosis present

## 2015-08-18 DIAGNOSIS — Z7982 Long term (current) use of aspirin: Secondary | ICD-10-CM | POA: Diagnosis not present

## 2015-08-18 DIAGNOSIS — Z87891 Personal history of nicotine dependence: Secondary | ICD-10-CM | POA: Insufficient documentation

## 2015-08-18 DIAGNOSIS — F329 Major depressive disorder, single episode, unspecified: Secondary | ICD-10-CM | POA: Insufficient documentation

## 2015-08-18 DIAGNOSIS — Z8673 Personal history of transient ischemic attack (TIA), and cerebral infarction without residual deficits: Secondary | ICD-10-CM | POA: Insufficient documentation

## 2015-08-18 DIAGNOSIS — M1990 Unspecified osteoarthritis, unspecified site: Secondary | ICD-10-CM | POA: Diagnosis not present

## 2015-08-18 DIAGNOSIS — Z85828 Personal history of other malignant neoplasm of skin: Secondary | ICD-10-CM | POA: Diagnosis not present

## 2015-08-18 MED ORDER — CLINDAMYCIN HCL 300 MG PO CAPS: 300.0000 mg | ORAL_CAPSULE | Freq: Four times a day (QID) | ORAL | Status: AC

## 2015-08-18 MED ORDER — CLINDAMYCIN HCL 150 MG PO CAPS
300.0000 mg | ORAL_CAPSULE | Freq: Once | ORAL | Status: AC
  Administered 2015-08-18: 300 mg via ORAL
  Filled 2015-08-18: qty 2

## 2015-08-18 NOTE — ED Notes (Signed)
Reddened area on skin marked.

## 2015-08-18 NOTE — ED Notes (Signed)
Dressing removed with normal saline, dressing was dry and attached to skin.

## 2015-08-18 NOTE — ED Provider Notes (Signed)
CSN: [id number]     Arrival date & time 08/18/15  1116 History   First MD Initiated Contact with Patient 08/18/15 1135     Chief Complaint  Patient presents with  . Wound recheck      (Consider location/radiation/quality/duration/timing/severity/associated sxs/prior Treatment) HPI Patient was seen 4 days ago with right calf skin tear. Was advised to follow-up with her primary physician this week for recheck the wound. Patient states that pain was improving until yesterday. She states she's had worsening pain since. Denies any fever or chills. She states she has had swelling to the right lower extremity especially in the calf and ankle region. She's had no fever or chills. No nausea or vomiting. States she went to her doctor's office this morning because she thought she needed sutures and was told to go to the emergency department. Past Medical History  Diagnosis Date  . Colon polyps     Adenomatus and Hyperplastic  . Internal hemorrhoids   . Gastritis   . Arthritis   . TIA (transient ischemic attack)   . Irritable bowel   . Reflux esophagitis 2011  . Gastric ulcer     multiple linear ulcers in body of stomach  . Gastric polyps   . Antritis (stomach) 2003    erosive  . Allergy   . Blood transfusion   . Cancer ([place])     skin  . GERD (gastroesophageal reflux disease)   . Stroke ([place])   . Depression    Past Surgical History  Procedure Laterality Date  . Colonoscopy  '88,'89,'93,'98'05    Dierdre Searles MD, [facility]  . Esophagogastroduodenoscopy  '02,'03,'05    Dierdre Searles MD, [facility]  . Appendectomy    . Total hip arthroplasty  '00    bilateral  . Back surgery  05/14/2013   Family History  Problem Relation Age of Onset  . Colon polyps Mother   . Breast cancer Sister   . Heart disease Father   . Diabetes Neg Hx    Social History  Substance Use Topics  . Smoking status: Former Research scientist (life sciences)  . Smokeless tobacco: Never Used  . Alcohol Use: Yes   Comment: 2 glasses a day, occ martini or scotch   OB History    No data available     Review of Systems  Constitutional: Negative for fever and chills.  Cardiovascular: Positive for leg swelling.  Musculoskeletal: Negative for myalgias and arthralgias.  Skin: Positive for color change and wound.  Neurological: Negative for weakness and numbness.  All other systems reviewed and are negative.     Allergies  Penicillins  Home Medications   Prior to Admission medications   Medication Sig Start Date End Date Taking? Authorizing Provider  aspirin 81 MG tablet Take 81 mg by mouth daily.    Historical Provider, MD  cholecalciferol (VITAMIN D) 1000 UNITS tablet Take 2,000 Units by mouth daily.     Historical Provider, MD  clindamycin (CLEOCIN) 300 MG capsule Take 1 capsule (300 mg total) by mouth 4 (four) times daily. X 7 days 08/18/15   Julianne Rice, MD  escitalopram (LEXAPRO) 10 MG tablet Take 5 mg by mouth daily.    Historical Provider, MD  fish oil-omega-3 fatty acids 1000 MG capsule Take 1 g by mouth daily.    Historical Provider, MD  methylcellulose (ARTIFICIAL TEARS) 1 % ophthalmic solution Place 1 drop into both eyes as needed. Dry eyes.    Historical Provider, MD  Misc Natural  Products (NARCOSOFT HERBAL LAX PO) Take 1-2 tablets by mouth at bedtime as needed. constipation    Historical Provider, MD  Multiple Vitamin (MULTIVITAMIN WITH MINERALS) TABS tablet Take 1 tablet by mouth daily.    Historical Provider, MD  Probiotic Product ([place]) Take 1 capsule by mouth daily.     Historical Provider, MD   BP 133/68 mmHg  Pulse 68  Temp(Src) 98.4 F (36.9 C) (Oral)  Resp 18  Ht 5\' 3"  (1.6 m)  Wt 80 lb (36.288 kg)  BMI 14.18 kg/m2  SpO2 97% Physical Exam  Constitutional: She is oriented to person, place, and time. She appears well-developed and well-nourished. No distress.  HENT:  Head: Normocephalic and atraumatic.  Mouth/Throat: Oropharynx is clear and  moist.  Eyes: EOM are normal. Pupils are equal, round, and reactive to light.  Neck: Normal range of motion. Neck supple.  Cardiovascular: Normal rate and regular rhythm.   Pulmonary/Chest: Effort normal and breath sounds normal. No respiratory distress. She has no wheezes. She has no rales.  Abdominal: Soft. Bowel sounds are normal.  Musculoskeletal: Normal range of motion. She exhibits no edema or tenderness.  Neurological: She is alert and oriented to person, place, and time.  Skin: Skin is warm and dry. No rash noted. There is erythema.  4 cm superficial skin tear to the posterior right lower extremity. There is mild warmth and surrounding erythema. No evidence of tracking. No purulence. So has 1+ pitting edema to the right ankle. No calf swelling or tenderness.  Psychiatric: She has a normal mood and affect. Her behavior is normal.  Nursing note and vitals reviewed.   ED Course  Procedures (including critical care time) Labs Review Labs Reviewed - No data to display  Imaging Review No results found. I have personally reviewed and evaluated these images and lab results as part of my medical decision-making.   EKG Interpretation None      MDM   Final diagnoses:  Wound infection ([place])    Concern for early wound infection. Will start on antibiotics and have the patient return in 24 hours for reevaluation. Erythema was marked. Patient understands the need to return immediately for any worsening of the redness, fever or for any concerns.    Julianne Rice, MD 08/18/15 1235

## 2015-08-18 NOTE — ED Notes (Signed)
Recheck wound on right leg

## 2015-08-18 NOTE — Discharge Instructions (Signed)
Cellulitis °Cellulitis is an infection of the skin and the tissue under the skin. The infected area is usually red and tender. This happens most often in the arms and lower legs. °HOME CARE  °· Take your antibiotic medicine as told. Finish the medicine even if you start to feel better. °· Keep the infected arm or leg raised (elevated). °· Put a warm cloth on the area up to 4 times per day. °· Only take medicines as told by your doctor. °· Keep all doctor visits as told. °GET HELP IF: °· You see red streaks on the skin coming from the infected area. °· Your red area gets bigger or turns a dark color. °· Your bone or joint under the infected area is painful after the skin heals. °· Your infection comes back in the same area or different area. °· You have a puffy (swollen) bump in the infected area. °· You have new symptoms. °· You have a fever. °GET HELP RIGHT AWAY IF:  °· You feel very sleepy. °· You throw up (vomit) or have watery poop (diarrhea). °· You feel sick and have muscle aches and pains. °  °This information is not intended to replace advice given to you by your health care provider. Make sure you discuss any questions you have with your health care provider. °  °Document Released: 08/04/2007 Document Revised: 11/06/2014 Document Reviewed: 05/03/2011 °Elsevier Interactive Patient Education ©2016 Elsevier [employer] ° °

## 2015-08-18 NOTE — ED Notes (Signed)
vaseline guaze applied to right posterior shin, wrapped with kerlix and secured with cobain, educated patient on dressing care and pt verbalized understanding for returning tomorrow for follow up or to primary care physician.

## 2015-08-19 ENCOUNTER — Encounter (HOSPITAL_BASED_OUTPATIENT_CLINIC_OR_DEPARTMENT_OTHER): Payer: Self-pay | Admitting: Emergency Medicine

## 2015-08-19 ENCOUNTER — Emergency Department (HOSPITAL_BASED_OUTPATIENT_CLINIC_OR_DEPARTMENT_OTHER)
Admission: EM | Admit: 2015-08-19 | Discharge: 2015-08-19 | Disposition: A | Discharge status: Home / Self Care | Payer: Medicare Other | Attending: Emergency Medicine | Admitting: Emergency Medicine

## 2015-08-19 ENCOUNTER — Ambulatory Visit: Payer: BLUE CROSS/BLUE SHIELD | Admitting: Physical Therapy

## 2015-08-19 DIAGNOSIS — F329 Major depressive disorder, single episode, unspecified: Secondary | ICD-10-CM | POA: Insufficient documentation

## 2015-08-19 DIAGNOSIS — Z7982 Long term (current) use of aspirin: Secondary | ICD-10-CM | POA: Diagnosis not present

## 2015-08-19 DIAGNOSIS — Z5189 Encounter for other specified aftercare: Secondary | ICD-10-CM

## 2015-08-19 DIAGNOSIS — Z85828 Personal history of other malignant neoplasm of skin: Secondary | ICD-10-CM | POA: Insufficient documentation

## 2015-08-19 DIAGNOSIS — Z87891 Personal history of nicotine dependence: Secondary | ICD-10-CM | POA: Diagnosis not present

## 2015-08-19 DIAGNOSIS — Z8673 Personal history of transient ischemic attack (TIA), and cerebral infarction without residual deficits: Secondary | ICD-10-CM | POA: Insufficient documentation

## 2015-08-19 DIAGNOSIS — L03116 Cellulitis of left lower limb: Secondary | ICD-10-CM | POA: Insufficient documentation

## 2015-08-19 DIAGNOSIS — M1990 Unspecified osteoarthritis, unspecified site: Secondary | ICD-10-CM | POA: Insufficient documentation

## 2015-08-19 DIAGNOSIS — Z4800 Encounter for change or removal of nonsurgical wound dressing: Secondary | ICD-10-CM | POA: Diagnosis present

## 2015-08-19 NOTE — ED Provider Notes (Signed)
CSN: [id number]     Arrival date & time 08/19/15  1208 History   First MD Initiated Contact with Patient 08/19/15 1402     Chief Complaint  Patient presents with  . Wound Check     (Consider location/radiation/quality/duration/timing/severity/associated sxs/prior Treatment) HPI 82 year old female who returns for wound check. She was seen initially on June 15 after a skin tear that was debrided. Return to the ED yesterday for increased swelling and redness in her leg over the course of the past day. Was started on clindamycin with concern for cellulitis, and was told to return to ED for 1 day follow-up for wound recheck. Area of redness was demarcated. She feels that the redness is receding, although her swelling has remained stable. She has not had fever, chills, nausea or vomiting, night sweats, worsening pain or swelling. Denies any associating numbness or weakness of her leg.   Past Medical History  Diagnosis Date  . Colon polyps     Adenomatus and Hyperplastic  . Internal hemorrhoids   . Gastritis   . Arthritis   . TIA (transient ischemic attack)   . Irritable bowel   . Reflux esophagitis 2011  . Gastric ulcer     multiple linear ulcers in body of stomach  . Gastric polyps   . Antritis (stomach) 2003    erosive  . Allergy   . Blood transfusion   . Cancer ([place])     skin  . GERD (gastroesophageal reflux disease)   . Stroke ([place])   . Depression    Past Surgical History  Procedure Laterality Date  . Colonoscopy  '88,'89,'93,'98'05    Dierdre Searles MD, [facility]  . Esophagogastroduodenoscopy  '02,'03,'05    Dierdre Searles MD, [facility]  . Appendectomy    . Total hip arthroplasty  '00    bilateral  . Back surgery  05/14/2013   Family History  Problem Relation Age of Onset  . Colon polyps Mother   . Breast cancer Sister   . Heart disease Father   . Diabetes Neg Hx    Social History  Substance Use Topics  . Smoking status: Former Research scientist (life sciences)  . Smokeless  tobacco: Never Used  . Alcohol Use: Yes     Comment: 2 glasses a day, occ martini or scotch   OB History    No data available     Review of Systems 10/14 systems reviewed and are negative other than those stated in the HPI    Allergies  Penicillins  Home Medications   Prior to Admission medications   Medication Sig Start Date End Date Taking? Authorizing Provider  aspirin 81 MG tablet Take 81 mg by mouth daily.    Historical Provider, MD  cholecalciferol (VITAMIN D) 1000 UNITS tablet Take 2,000 Units by mouth daily.     Historical Provider, MD  clindamycin (CLEOCIN) 300 MG capsule Take 1 capsule (300 mg total) by mouth 4 (four) times daily. X 7 days 08/18/15   Julianne Rice, MD  escitalopram (LEXAPRO) 10 MG tablet Take 5 mg by mouth daily.    Historical Provider, MD  fish oil-omega-3 fatty acids 1000 MG capsule Take 1 g by mouth daily.    Historical Provider, MD  methylcellulose (ARTIFICIAL TEARS) 1 % ophthalmic solution Place 1 drop into both eyes as needed. Dry eyes.    Historical Provider, MD  Misc Natural Products (NARCOSOFT HERBAL LAX PO) Take 1-2 tablets by mouth at bedtime as needed. constipation  Historical Provider, MD  Multiple Vitamin (MULTIVITAMIN WITH MINERALS) TABS tablet Take 1 tablet by mouth daily.    Historical Provider, MD  Probiotic Product ([place]) Take 1 capsule by mouth daily.     Historical Provider, MD   BP 136/71 mmHg  Pulse 61  Temp(Src) 98.3 F (36.8 C) (Oral)  Resp 18  Wt 85 lb (38.556 kg)  SpO2 100% Physical Exam Physical Exam  Nursing note and vitals reviewed. Constitutional: Well developed, well nourished, non-toxic, and in no acute distress Head: Normocephalic and atraumatic.  Mouth/Throat: Moist mucous membranes. Neck: Neck supple.  Cardiovascular: +2 DP pulses.  Pulmonary/Chest: Effort normal and breath sounds normal.  Abdominal: Soft. There is no tenderness. There is no rebound and no guarding.  Musculoskeletal:  Normal range of motion of knee and ankle.  Neurological: Alert, no facial droop, fluent speech, moves all extremities symmetrically, in tact sensation to light touch Skin: Skin is warm and dry. Normal distal capillary refill in feet. There if 4 x 3 cm superficial wound tear distal to the right calf with milder erythema surrounding and no drainage or purulence. Area of redness receded from line of demarcation. +1 pedal edema to right ankle. No calf tenderness. Psychiatric: Cooperative  ED Course  Procedures (including critical care time) Labs Review Labs Reviewed - No data to display  Imaging Review No results found. I have personally reviewed and evaluated these images and lab results as part of my medical decision-making.   EKG Interpretation None      MDM   Final diagnoses:  Visit for wound check  Cellulitis of left lower extremity    82 year old female who returns for 1 day wound check after treatment for cellulitis due to associating skin tear. She is well-appearing. She is afebrile with normal vital signs. No systemic signs or symptoms of illness. She does have superficial skin tear just distal to the right calf, with some mild surrounding erythema and warmth. This area has receded from the demarcated marker line that was drawn one day ago. With some persistent edema although this is unchanged from one day ago. It does appear that antibiotics are working. She will continue with clindamycin, and I told her to return to ED for any worsening redness, swelling, purulent drainage, or fever. She will otherwise follow up closely with her primary care doctor.      Forde Dandy, MD 08/19/15 [phone]

## 2015-08-19 NOTE — ED Notes (Signed)
Here for wound check

## 2015-08-19 NOTE — ED Notes (Signed)
In to see Pt.   Dressing in tact.  Pt. Fussing about having to wait to see Dr. And the wait yesterday.  RN gave apology to Pt. For the wait and Pt. Still complaining.  Pt. Cont. To complain and fuss about many things.  RN just listened to Pt.  Pt. Then asked RN if I have anything to say  RN Rosana Hoes told Pt. No I have nothing to say so she told me to go and get out of fast track.  RN stepped out of area.

## 2015-08-19 NOTE — Discharge Instructions (Signed)
Continue the antibiotic. Return without fail for worsening symptoms, including fever, worsening pain, worsening redness or swelling, or any other symptoms concerning to you.  Cellulitis Cellulitis is an infection of the skin and the tissue under the skin. The infected area is usually red and tender. This happens most often in the arms and lower legs. HOME CARE   Take your antibiotic medicine as told. Finish the medicine even if you start to feel better.  Keep the infected arm or leg raised (elevated).  Put a warm cloth on the area up to 4 times per day.  Only take medicines as told by your doctor.  Keep all doctor visits as told. GET HELP IF:  You see red streaks on the skin coming from the infected area.  Your red area gets bigger or turns a dark color.  Your bone or joint under the infected area is painful after the skin heals.  Your infection comes back in the same area or different area.  You have a puffy (swollen) bump in the infected area.  You have new symptoms.  You have a fever. GET HELP RIGHT AWAY IF:   You feel very sleepy.  You throw up (vomit) or have watery poop (diarrhea).  You feel sick and have muscle aches and pains.   This information is not intended to replace advice given to you by your health care provider. Make sure you discuss any questions you have with your health care provider.   Document Released: 08/04/2007 Document Revised: 11/06/2014 Document Reviewed: 05/03/2011 Elsevier Interactive Patient Education [employer].

## 2015-08-20 ENCOUNTER — Ambulatory Visit: Payer: Medicare Other | Attending: Internal Medicine | Admitting: Physical Therapy

## 2015-08-20 ENCOUNTER — Encounter: Payer: Self-pay | Admitting: Physical Therapy

## 2015-08-20 DIAGNOSIS — M6281 Muscle weakness (generalized): Secondary | ICD-10-CM | POA: Diagnosis not present

## 2015-08-20 DIAGNOSIS — R262 Difficulty in walking, not elsewhere classified: Secondary | ICD-10-CM | POA: Diagnosis not present

## 2015-08-20 DIAGNOSIS — M25571 Pain in right ankle and joints of right foot: Secondary | ICD-10-CM | POA: Insufficient documentation
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
## 2015-08-22 ENCOUNTER — Ambulatory Visit: Payer: Medicare Other | Admitting: Physical Therapy

## 2015-08-22 DIAGNOSIS — I739 Peripheral vascular disease, unspecified: Secondary | ICD-10-CM | POA: Diagnosis not present

## 2015-08-22 DIAGNOSIS — S81801A Unspecified open wound, right lower leg, initial encounter: Secondary | ICD-10-CM | POA: Diagnosis not present

## 2015-08-22 DIAGNOSIS — Z681 Body mass index (BMI) 19 or less, adult: Secondary | ICD-10-CM | POA: Diagnosis not present

## 2015-08-24 ENCOUNTER — Emergency Department (HOSPITAL_COMMUNITY)
Admission: EM | Admit: 2015-08-24 | Discharge: 2015-08-24 | Disposition: A | Discharge status: Home / Self Care | Payer: Medicare Other | Attending: Emergency Medicine | Admitting: Emergency Medicine

## 2015-08-24 ENCOUNTER — Emergency Department (HOSPITAL_COMMUNITY): Payer: Medicare Other

## 2015-08-24 ENCOUNTER — Encounter (HOSPITAL_COMMUNITY): Payer: Self-pay | Admitting: *Deleted

## 2015-08-24 ENCOUNTER — Other Ambulatory Visit: Payer: Self-pay

## 2015-08-24 DIAGNOSIS — R1310 Dysphagia, unspecified: Secondary | ICD-10-CM | POA: Insufficient documentation

## 2015-08-24 DIAGNOSIS — Z7982 Long term (current) use of aspirin: Secondary | ICD-10-CM | POA: Insufficient documentation

## 2015-08-24 DIAGNOSIS — Z96643 Presence of artificial hip joint, bilateral: Secondary | ICD-10-CM | POA: Insufficient documentation

## 2015-08-24 DIAGNOSIS — Z8673 Personal history of transient ischemic attack (TIA), and cerebral infarction without residual deficits: Secondary | ICD-10-CM | POA: Diagnosis not present

## 2015-08-24 DIAGNOSIS — Z87891 Personal history of nicotine dependence: Secondary | ICD-10-CM | POA: Diagnosis not present

## 2015-08-24 DIAGNOSIS — F329 Major depressive disorder, single episode, unspecified: Secondary | ICD-10-CM | POA: Diagnosis not present

## 2015-08-24 DIAGNOSIS — R079 Chest pain, unspecified: Secondary | ICD-10-CM | POA: Diagnosis present

## 2015-08-24 DIAGNOSIS — R0789 Other chest pain: Secondary | ICD-10-CM | POA: Diagnosis not present

## 2015-08-24 LAB — CBC
HCT: 34.4 % — ABNORMAL LOW (ref 36.0–46.0)
Hemoglobin: 12 g/dL (ref 12.0–15.0)
MCH: 33.6 pg (ref 26.0–34.0)
MCHC: 34.9 g/dL (ref 30.0–36.0)
MCV: 96.4 fL (ref 78.0–100.0)
PLATELETS: 287 10*3/uL (ref 150–400)
RBC: 3.57 MIL/uL — AB (ref 3.87–5.11)
RDW: 12.6 % (ref 11.5–15.5)
WBC: 4.5 10*3/uL (ref 4.0–10.5)

## 2015-08-24 LAB — I-STAT TROPONIN, ED: Troponin i, poc: 0 ng/mL (ref 0.00–0.08)

## 2015-08-24 LAB — BASIC METABOLIC PANEL
Anion gap: 7 (ref 5–15)
BUN: 14 mg/dL (ref 6–20)
CALCIUM: 9.3 mg/dL (ref 8.9–10.3)
CO2: 25 mmol/L (ref 22–32)
CREATININE: 0.44 mg/dL (ref 0.44–1.00)
Chloride: 97 mmol/L — ABNORMAL LOW (ref 101–111)
GFR calc non Af Amer: >60 mL/min (ref >60)
Glucose, Bld: 89 mg/dL (ref 65–99)
Potassium: 4.1 mmol/L (ref 3.5–5.1)
SODIUM: 129 mmol/L — AB (ref 135–145)

## 2015-08-24 LAB — TROPONIN I

## 2015-08-24 NOTE — Discharge Instructions (Signed)

## 2015-08-24 NOTE — ED Provider Notes (Signed)
CSN: [id number]     Arrival date & time 08/24/15  1105 History   First MD Initiated Contact with Patient 08/24/15 1126     Chief Complaint  Patient presents with  . Chest Pain      HPI Patient was at home and took an antibiotic pill and felt like it got stuck in her throat.  And she developed discomfort in her throat and chest area.  Eventually she drank water and the feeling went away.  She feels back to baseline now.  She denies diaphoresis, nausea, or vomiting.  She has history of reflux. Past Medical History  Diagnosis Date  . Colon polyps     Adenomatus and Hyperplastic  . Internal hemorrhoids   . Gastritis   . Arthritis   . TIA (transient ischemic attack)   . Irritable bowel   . Reflux esophagitis 2011  . Gastric ulcer     multiple linear ulcers in body of stomach  . Gastric polyps   . Antritis (stomach) 2003    erosive  . Allergy   . Blood transfusion   . Cancer ([place])     skin  . GERD (gastroesophageal reflux disease)   . Stroke ([place])   . Depression    Past Surgical History  Procedure Laterality Date  . Colonoscopy  '88,'89,'93,'98'05    Dierdre Searles MD, [facility]  . Esophagogastroduodenoscopy  '02,'03,'05    Dierdre Searles MD, [facility]  . Appendectomy    . Total hip arthroplasty  '00    bilateral  . Back surgery  05/14/2013   Family History  Problem Relation Age of Onset  . Colon polyps Mother   . Breast cancer Sister   . Heart disease Father   . Diabetes Neg Hx    Social History  Substance Use Topics  . Smoking status: Former Research scientist (life sciences)  . Smokeless tobacco: Never Used  . Alcohol Use: Yes     Comment: 2 glasses a day, occ martini or scotch   OB History    No data available     Review of Systems  All other systems reviewed and are negative  Allergies  Penicillins  Home Medications   Prior to Admission medications   Medication Sig Start Date End Date Taking? Authorizing Provider  aspirin 81 MG tablet Take 81 mg by mouth  daily.   Yes Historical Provider, MD  calcium carbonate (OSCAL) 1500 (600 Ca) MG TABS tablet Take 1,500 mg by mouth 2 (two) times daily with a meal.   Yes Historical Provider, MD  clindamycin (CLEOCIN) 300 MG capsule Take 1 capsule (300 mg total) by mouth 4 (four) times daily. X 7 days 08/18/15  Yes Julianne Rice, MD  Misc Natural Products (NARCOSOFT HERBAL LAX PO) Take 1-2 tablets by mouth at bedtime as needed. Reported on 08/24/2015   Yes Historical Provider, MD  Multiple Vitamin (MULTIVITAMIN WITH MINERALS) TABS tablet Take 1 tablet by mouth daily.   Yes Historical Provider, MD   BP 164/52 mmHg  Pulse 67  Temp(Src) 97.3 F (36.3 C) (Oral)  Resp 16  SpO2 99% Physical Exam Physical Exam  Nursing note and vitals reviewed. Constitutional: She is oriented to person, place, and time. She appears well-developed and well-nourished. No distress.  HENT:  Head: Normocephalic and atraumatic.  Eyes: Pupils are equal, round, and reactive to light.  Neck: Normal range of motion.  Cardiovascular: Normal rate and intact distal pulses.   Pulmonary/Chest: No respiratory distress.  Abdominal: Normal appearance.  She exhibits no distension.  Musculoskeletal: Normal range of motion.  Neurological: She is alert and oriented to person, place, and time. No cranial nerve deficit.  Skin: Skin is warm and dry. No rash noted.  Psychiatric: She has a normal mood and affect. Her behavior is normal.   ED Course  Procedures (including critical care time) Labs Review Labs Reviewed  BASIC METABOLIC PANEL - Abnormal; Notable for the following:    Sodium 129 (*)    Chloride 97 (*)    All other components within normal limits  CBC - Abnormal; Notable for the following:    RBC 3.57 (*)    HCT 34.4 (*)    All other components within normal limits  TROPONIN I  TROPONIN I  TROPONIN I  I-STAT TROPOININ, ED    Imaging Review Dg Chest 2 View  08/24/2015  CLINICAL DATA:  Chest pain EXAM: CHEST  2 VIEW  COMPARISON:  03/08/2014 FINDINGS: Cardiomediastinal silhouette is stable. Hyperinflation again noted. No acute infiltrate or pleural effusion. No pulmonary edema. Mild degenerative changes mid thoracic spine. IMPRESSION: No active cardiopulmonary disease.  Hyperinflation again noted. Electronically Signed   By: Lahoma Crocker M.D.   On: 08/24/2015 12:35   I have personally reviewed and evaluated these images and lab results as part of my medical decision-making.   EKG Interpretation   Date/Time:  Sunday August 24 2015 11:11:25 EDT Ventricular Rate:  54 PR Interval:  166 QRS Duration: 80 QT Interval:  440 QTC Calculation: 417 R Axis:   -36 Text Interpretation:  Sinus bradycardia Left axis deviation Abnormal ECG  No significant change since last tracing Confirmed by Sereniti Wan  MD, Nathanyl Andujo  ([id number]) on 08/24/2015 11:27:08 AM     Patient was able to swallow her pills with no difficulty.  She feels back to baseline and wants to go home. MDM   Final diagnoses:  Dysphagia        Leonard Schwartz, MD 08/24/15 1402
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
## 2015-08-26 ENCOUNTER — Encounter: Payer: Self-pay | Admitting: Physical Therapy

## 2015-08-26 ENCOUNTER — Ambulatory Visit: Payer: Medicare Other | Admitting: Physical Therapy

## 2015-08-26 DIAGNOSIS — R262 Difficulty in walking, not elsewhere classified: Secondary | ICD-10-CM | POA: Diagnosis not present

## 2015-08-26 DIAGNOSIS — M6281 Muscle weakness (generalized): Secondary | ICD-10-CM | POA: Diagnosis not present

## 2015-08-26 DIAGNOSIS — M25571 Pain in right ankle and joints of right foot: Secondary | ICD-10-CM
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
## 2015-08-27 ENCOUNTER — Emergency Department (HOSPITAL_BASED_OUTPATIENT_CLINIC_OR_DEPARTMENT_OTHER)
Admission: EM | Admit: 2015-08-27 | Discharge: 2015-08-27 | Disposition: A | Discharge status: Home / Self Care | Payer: Medicare Other | Attending: Emergency Medicine | Admitting: Emergency Medicine

## 2015-08-27 ENCOUNTER — Encounter (HOSPITAL_BASED_OUTPATIENT_CLINIC_OR_DEPARTMENT_OTHER): Payer: Self-pay | Admitting: *Deleted

## 2015-08-27 DIAGNOSIS — F329 Major depressive disorder, single episode, unspecified: Secondary | ICD-10-CM | POA: Insufficient documentation

## 2015-08-27 DIAGNOSIS — Z87891 Personal history of nicotine dependence: Secondary | ICD-10-CM | POA: Diagnosis not present

## 2015-08-27 DIAGNOSIS — Z79899 Other long term (current) drug therapy: Secondary | ICD-10-CM | POA: Diagnosis not present

## 2015-08-27 DIAGNOSIS — Z4801 Encounter for change or removal of surgical wound dressing: Secondary | ICD-10-CM | POA: Diagnosis not present

## 2015-08-27 DIAGNOSIS — Z85828 Personal history of other malignant neoplasm of skin: Secondary | ICD-10-CM | POA: Diagnosis not present

## 2015-08-27 DIAGNOSIS — Z5189 Encounter for other specified aftercare: Secondary | ICD-10-CM

## 2015-08-27 DIAGNOSIS — Z8673 Personal history of transient ischemic attack (TIA), and cerebral infarction without residual deficits: Secondary | ICD-10-CM | POA: Diagnosis not present

## 2015-08-27 DIAGNOSIS — M1990 Unspecified osteoarthritis, unspecified site: Secondary | ICD-10-CM | POA: Diagnosis not present

## 2015-08-27 DIAGNOSIS — Z7982 Long term (current) use of aspirin: Secondary | ICD-10-CM | POA: Diagnosis not present

## 2015-08-27 DIAGNOSIS — Z4800 Encounter for change or removal of nonsurgical wound dressing: Secondary | ICD-10-CM | POA: Diagnosis not present
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
## 2015-08-27 NOTE — Discharge Instructions (Signed)
You were seen and evaluated today for removal of your dressing. Your skin tear appears to be healing well and there is no sign of infection. This will continue to take some time. Please continue to keep the area covered. Follow-up with your primary care physician for reevaluation.  Dressing Change A dressing is a material placed over wounds. It keeps the wound clean, dry, and protected from further injury. This provides an environment that favors wound healing.  BEFORE YOU BEGIN  Get your supplies together. Things you may need include:  Saline solution.  Flexible gauze dressing.  Medicated cream.  Tape.  Gloves.  Abdominal dressing pads.  Gauze squares.  Plastic bags.  Take pain medicine 30 minutes before the dressing change if you need it.  Take a shower before you do the first dressing change of the day. Use plastic wrap or a plastic bag to prevent the dressing from getting wet. REMOVING YOUR OLD DRESSING   Wash your hands with soap and water. Dry your hands with a clean towel.  Put on your gloves.  Remove any tape.  Carefully remove the old dressing. If the dressing sticks, you may dampen it with warm water to loosen it, or follow your caregiver's specific directions.  Remove any gauze or packing tape that is in your wound.  Take off your gloves.  Put the gloves, tape, gauze, or any packing tape into a plastic bag. CHANGING YOUR DRESSING  Open the supplies.  Take the cap off the saline solution.  Open the gauze package so that the gauze remains on the inside of the package.  Put on your gloves.  Clean your wound as told by your caregiver.  If you have been told to keep your wound dry, follow those instructions.  Your caregiver may tell you to do one or more of the following:  Pick up the gauze. Pour the saline solution over the gauze. Squeeze out the extra saline solution.  Put medicated cream or other medicine on your wound if you have been told to do  so.  Put the solution soaked gauze only in your wound, not on the skin around it.  Pack your wound loosely or as told by your caregiver.  Put dry gauze on your wound.  Put abdominal dressing pads over the dry gauze if your wet gauze soaks through.  Tape the abdominal dressing pads in place so they will not fall off. Do not wrap the tape completely around the affected part (arm, leg, abdomen).  Wrap the dressing pads with a flexible gauze dressing to secure it in place.  Take off your gloves. Put them in the plastic bag with the old dressing. Tie the bag shut and throw it away.  Keep the dressing clean and dry until your next dressing change.  Wash your hands. SEEK MEDICAL CARE IF:  Your skin around the wound looks red.  Your wound feels more tender or sore.  You see pus in the wound.  Your wound smells bad.  You have a fever.  Your skin around the wound has a rash that itches and burns.  You see black or yellow skin in your wound that was not there before.  You feel nauseous, throw up, and feel very tired.   This information is not intended to replace advice given to you by your health care provider. Make sure you discuss any questions you have with your health care provider.   Document Released: 03/25/2004 Document Revised: 05/10/2011 Document Reviewed:  12/28/2010 Elsevier Interactive Patient Education [employer].

## 2015-08-27 NOTE — ED Notes (Signed)
Pt verbalizes understanding of wound care

## 2015-08-27 NOTE — ED Notes (Signed)
Here for wound check and bandage change to right lower leg

## 2015-08-27 NOTE — ED Provider Notes (Signed)
CSN: [id number]     Arrival date & time 08/27/15  1042 History   First MD Initiated Contact with Patient 08/27/15 1059     Chief Complaint  Patient presents with  . Wound Check     (Consider location/radiation/quality/duration/timing/severity/associated sxs/prior Treatment) HPI Comments: 82 year old female presents for inability to remove her bandage from her leg and wound check. The patient has been here several times over the last week or so for wound checks after a skin tear had to be debrided. Patient states she's been trying to remove her bandage but was unable to and so she came in here for evaluation. She says she is otherwise feeling well. She has just completed her course of antibiotics for concern for surrounding infection of the wound.  Patient is a 82 y.o. female presenting with wound check.  Wound Check Pertinent negatives include no chest pain, no abdominal pain, no headaches and no shortness of breath.    Past Medical History  Diagnosis Date  . Colon polyps     Adenomatus and Hyperplastic  . Internal hemorrhoids   . Gastritis   . Arthritis   . TIA (transient ischemic attack)   . Irritable bowel   . Reflux esophagitis 2011  . Gastric ulcer     multiple linear ulcers in body of stomach  . Gastric polyps   . Antritis (stomach) 2003    erosive  . Allergy   . Blood transfusion   . Cancer ([place])     skin  . GERD (gastroesophageal reflux disease)   . Stroke ([place])   . Depression    Past Surgical History  Procedure Laterality Date  . Colonoscopy  '88,'89,'93,'98'05    Dierdre Searles MD, [facility]  . Esophagogastroduodenoscopy  '02,'03,'05    Dierdre Searles MD, [facility]  . Appendectomy    . Total hip arthroplasty  '00    bilateral  . Back surgery  05/14/2013   Family History  Problem Relation Age of Onset  . Colon polyps Mother   . Breast cancer Sister   . Heart disease Father   . Diabetes Neg Hx    Social History  Substance Use Topics  .  Smoking status: Former Research scientist (life sciences)  . Smokeless tobacco: Never Used  . Alcohol Use: Yes     Comment: 2 glasses a day, occ martini or scotch   OB History    No data available     Review of Systems  Constitutional: Negative for fever, chills and fatigue.  HENT: Negative for congestion and postnasal drip.   Respiratory: Negative for shortness of breath.   Cardiovascular: Negative for chest pain.  Gastrointestinal: Negative for abdominal pain.  Genitourinary: Negative for flank pain.  Musculoskeletal: Negative for myalgias and back pain.  Skin: Positive for wound.  Neurological: Negative for headaches.  Hematological: Does not bruise/bleed easily.      Allergies  Penicillins  Home Medications   Prior to Admission medications   Medication Sig Start Date End Date Taking? Authorizing Provider  aspirin 81 MG tablet Take 81 mg by mouth daily.   Yes Historical Provider, MD  calcium carbonate (OSCAL) 1500 (600 Ca) MG TABS tablet Take 1,500 mg by mouth 2 (two) times daily with a meal.   Yes Historical Provider, MD  Misc Natural Products (NARCOSOFT HERBAL LAX PO) Take 1-2 tablets by mouth at bedtime as needed. Reported on 08/24/2015   Yes Historical Provider, MD  Multiple Vitamin (MULTIVITAMIN WITH MINERALS) TABS tablet Take 1 tablet  by mouth daily.   Yes Historical Provider, MD  clindamycin (CLEOCIN) 300 MG capsule Take 1 capsule (300 mg total) by mouth 4 (four) times daily. X 7 days 08/18/15   Julianne Rice, MD   BP 133/61 mmHg  Pulse 63  Temp(Src) 98.4 F (36.9 C) (Oral)  Resp 18  Ht 5\' 3"  (1.6 m)  Wt 85 lb (38.556 kg)  BMI 15.06 kg/m2  SpO2 100% Physical Exam  Constitutional: She is oriented to person, place, and time. She appears well-developed and well-nourished. No distress.  HENT:  Head: Normocephalic and atraumatic.  Right Ear: External ear normal.  Left Ear: External ear normal.  Nose: Nose normal.  Eyes: EOM are normal.  Neck: Normal range of motion. Neck supple.    Cardiovascular: Normal rate and intact distal pulses.   Pulmonary/Chest: Effort normal. No respiratory distress.  Abdominal: She exhibits no distension.  Musculoskeletal: Normal range of motion. She exhibits no edema or tenderness.  Neurological: She is alert and oriented to person, place, and time.  Skin: Skin is warm and dry. No rash noted. She is not diaphoretic.     Vitals reviewed.   ED Course  Procedures (including critical care time) Labs Review Labs Reviewed - No data to display  Imaging Review No results found. I have personally reviewed and evaluated these images and lab results as part of my medical decision-making.   EKG Interpretation None      MDM  Patient seen and evaluated in stable condition. Well-healing skin wound. Dressing removed without difficulty or damage. New dressing was applied. Patient was educated on wound care and instructed to follow-up with her primary care physician. Final diagnoses:  None    1. Wound check 2. Dressing change    Harvel Quale, MD 08/27/15 1133

## 2015-08-27 NOTE — ED Notes (Addendum)
Pt reports she had not removed old dressing. Coban intact wrapped over bandaid removed by Dr Alfonse Spruce. Wound healing with no erythema or foul drainage noted. Pt reports she completed her antibiotics this am

## 2015-08-28 ENCOUNTER — Ambulatory Visit: Payer: Medicare Other | Admitting: Physical Therapy

## 2015-08-28 ENCOUNTER — Encounter: Payer: Self-pay | Admitting: Physical Therapy

## 2015-08-28 DIAGNOSIS — M6281 Muscle weakness (generalized): Secondary | ICD-10-CM

## 2015-08-28 DIAGNOSIS — M25571 Pain in right ankle and joints of right foot: Secondary | ICD-10-CM | POA: Diagnosis not present

## 2015-08-28 DIAGNOSIS — R262 Difficulty in walking, not elsewhere classified: Secondary | ICD-10-CM | POA: Diagnosis not present
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
## 2015-09-10 ENCOUNTER — Ambulatory Visit: Payer: Medicare Other | Attending: Internal Medicine | Admitting: Physical Therapy

## 2015-09-10 ENCOUNTER — Encounter: Payer: Self-pay | Admitting: Physical Therapy

## 2015-09-10 DIAGNOSIS — R262 Difficulty in walking, not elsewhere classified: Secondary | ICD-10-CM | POA: Diagnosis not present

## 2015-09-10 DIAGNOSIS — M6281 Muscle weakness (generalized): Secondary | ICD-10-CM | POA: Diagnosis not present
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
## 2015-09-16 DIAGNOSIS — C44519 Basal cell carcinoma of skin of other part of trunk: Secondary | ICD-10-CM | POA: Diagnosis not present

## 2015-09-17 ENCOUNTER — Ambulatory Visit: Payer: Medicare Other | Admitting: Physical Therapy

## 2015-11-12 DIAGNOSIS — H2589 Other age-related cataract: Secondary | ICD-10-CM | POA: Diagnosis not present

## 2015-11-12 DIAGNOSIS — H2513 Age-related nuclear cataract, bilateral: Secondary | ICD-10-CM | POA: Diagnosis not present

## 2015-11-12 DIAGNOSIS — H04123 Dry eye syndrome of bilateral lacrimal glands: Secondary | ICD-10-CM | POA: Diagnosis not present

## 2016-03-10 DIAGNOSIS — Z981 Arthrodesis status: Secondary | ICD-10-CM | POA: Diagnosis not present

## 2016-03-10 DIAGNOSIS — M25552 Pain in left hip: Secondary | ICD-10-CM | POA: Diagnosis not present

## 2016-03-10 DIAGNOSIS — Z96643 Presence of artificial hip joint, bilateral: Secondary | ICD-10-CM | POA: Diagnosis not present

## 2016-03-10 DIAGNOSIS — S79912A Unspecified injury of left hip, initial encounter: Secondary | ICD-10-CM | POA: Diagnosis not present

## 2016-03-16 DIAGNOSIS — R600 Localized edema: Secondary | ICD-10-CM | POA: Diagnosis not present

## 2016-03-16 DIAGNOSIS — F418 Other specified anxiety disorders: Secondary | ICD-10-CM | POA: Diagnosis not present

## 2016-03-16 DIAGNOSIS — D539 Nutritional anemia, unspecified: Secondary | ICD-10-CM | POA: Diagnosis not present

## 2016-03-16 DIAGNOSIS — M810 Age-related osteoporosis without current pathological fracture: Secondary | ICD-10-CM | POA: Diagnosis not present

## 2016-03-16 DIAGNOSIS — E784 Other hyperlipidemia: Secondary | ICD-10-CM | POA: Diagnosis not present

## 2016-03-16 DIAGNOSIS — I6529 Occlusion and stenosis of unspecified carotid artery: Secondary | ICD-10-CM | POA: Diagnosis not present

## 2016-03-16 DIAGNOSIS — R2689 Other abnormalities of gait and mobility: Secondary | ICD-10-CM | POA: Diagnosis not present

## 2016-03-16 DIAGNOSIS — I7389 Other specified peripheral vascular diseases: Secondary | ICD-10-CM | POA: Diagnosis not present

## 2016-03-16 DIAGNOSIS — Z681 Body mass index (BMI) 19 or less, adult: Secondary | ICD-10-CM | POA: Diagnosis not present

## 2016-03-16 DIAGNOSIS — I638 Other cerebral infarction: Secondary | ICD-10-CM | POA: Diagnosis not present

## 2016-03-16 DIAGNOSIS — F0390 Unspecified dementia without behavioral disturbance: Secondary | ICD-10-CM | POA: Diagnosis not present

## 2016-03-16 DIAGNOSIS — M4807 Spinal stenosis, lumbosacral region: Secondary | ICD-10-CM | POA: Diagnosis not present

## 2016-03-16 DIAGNOSIS — J449 Chronic obstructive pulmonary disease, unspecified: Secondary | ICD-10-CM | POA: Diagnosis not present

## 2016-03-19 ENCOUNTER — Other Ambulatory Visit: Payer: Self-pay | Admitting: Internal Medicine

## 2016-03-19 DIAGNOSIS — M48061 Spinal stenosis, lumbar region without neurogenic claudication: Secondary | ICD-10-CM

## 2016-03-22 ENCOUNTER — Ambulatory Visit
Admission: RE | Admit: 2016-03-22 | Discharge: 2016-03-22 | Disposition: A | Discharge status: Home / Self Care | Payer: Medicare Other | Source: Ambulatory Visit | Attending: Internal Medicine | Admitting: Internal Medicine

## 2016-03-22 DIAGNOSIS — M48061 Spinal stenosis, lumbar region without neurogenic claudication: Secondary | ICD-10-CM | POA: Diagnosis not present

## 2016-03-23 DIAGNOSIS — H6593 Unspecified nonsuppurative otitis media, bilateral: Secondary | ICD-10-CM | POA: Diagnosis not present

## 2016-03-23 DIAGNOSIS — H9190 Unspecified hearing loss, unspecified ear: Secondary | ICD-10-CM | POA: Diagnosis not present

## 2016-03-23 DIAGNOSIS — H906 Mixed conductive and sensorineural hearing loss, bilateral: Secondary | ICD-10-CM | POA: Diagnosis not present

## 2016-04-01 DIAGNOSIS — H6520 Chronic serous otitis media, unspecified ear: Secondary | ICD-10-CM | POA: Diagnosis not present

## 2016-04-01 DIAGNOSIS — H9190 Unspecified hearing loss, unspecified ear: Secondary | ICD-10-CM | POA: Diagnosis not present

## 2016-04-01 DIAGNOSIS — H906 Mixed conductive and sensorineural hearing loss, bilateral: Secondary | ICD-10-CM | POA: Diagnosis not present

## 2016-04-01 DIAGNOSIS — H6593 Unspecified nonsuppurative otitis media, bilateral: Secondary | ICD-10-CM | POA: Diagnosis not present

## 2016-04-07 DIAGNOSIS — H906 Mixed conductive and sensorineural hearing loss, bilateral: Secondary | ICD-10-CM | POA: Diagnosis not present

## 2016-04-07 DIAGNOSIS — H6520 Chronic serous otitis media, unspecified ear: Secondary | ICD-10-CM | POA: Diagnosis not present
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
## 2016-04-15 DIAGNOSIS — H906 Mixed conductive and sensorineural hearing loss, bilateral: Secondary | ICD-10-CM | POA: Diagnosis not present

## 2016-04-15 DIAGNOSIS — H6520 Chronic serous otitis media, unspecified ear: Secondary | ICD-10-CM | POA: Diagnosis not present

## 2016-10-14 DIAGNOSIS — H906 Mixed conductive and sensorineural hearing loss, bilateral: Secondary | ICD-10-CM | POA: Diagnosis not present

## 2017-01-14 DIAGNOSIS — Z23 Encounter for immunization: Secondary | ICD-10-CM | POA: Diagnosis not present

## 2017-02-18 DIAGNOSIS — H906 Mixed conductive and sensorineural hearing loss, bilateral: Secondary | ICD-10-CM | POA: Diagnosis not present

## 2017-04-22 DIAGNOSIS — S199XXA Unspecified injury of neck, initial encounter: Secondary | ICD-10-CM | POA: Diagnosis not present

## 2017-04-22 DIAGNOSIS — M50321 Other cervical disc degeneration at C4-C5 level: Secondary | ICD-10-CM | POA: Diagnosis not present

## 2017-04-22 DIAGNOSIS — S0101XA Laceration without foreign body of scalp, initial encounter: Secondary | ICD-10-CM | POA: Diagnosis not present

## 2017-04-22 DIAGNOSIS — Z87891 Personal history of nicotine dependence: Secondary | ICD-10-CM | POA: Diagnosis not present

## 2017-04-22 DIAGNOSIS — F329 Major depressive disorder, single episode, unspecified: Secondary | ICD-10-CM | POA: Diagnosis not present

## 2017-04-22 DIAGNOSIS — S0990XA Unspecified injury of head, initial encounter: Secondary | ICD-10-CM | POA: Diagnosis not present

## 2017-06-29 DIAGNOSIS — I679 Cerebrovascular disease, unspecified: Secondary | ICD-10-CM | POA: Diagnosis not present

## 2017-06-29 DIAGNOSIS — Z0000 Encounter for general adult medical examination without abnormal findings: Secondary | ICD-10-CM | POA: Diagnosis not present

## 2017-06-29 DIAGNOSIS — Z789 Other specified health status: Secondary | ICD-10-CM | POA: Diagnosis not present

## 2017-06-29 DIAGNOSIS — Z681 Body mass index (BMI) 19 or less, adult: Secondary | ICD-10-CM | POA: Diagnosis not present

## 2017-06-29 DIAGNOSIS — G8929 Other chronic pain: Secondary | ICD-10-CM | POA: Diagnosis not present

## 2017-06-29 DIAGNOSIS — F0390 Unspecified dementia without behavioral disturbance: Secondary | ICD-10-CM | POA: Diagnosis not present

## 2017-06-29 DIAGNOSIS — M545 Low back pain: Secondary | ICD-10-CM | POA: Diagnosis not present

## 2017-06-29 DIAGNOSIS — M79672 Pain in left foot: Secondary | ICD-10-CM | POA: Diagnosis not present

## 2017-06-29 DIAGNOSIS — R55 Syncope and collapse: Secondary | ICD-10-CM | POA: Diagnosis not present

## 2017-06-29 DIAGNOSIS — F3340 Major depressive disorder, recurrent, in remission, unspecified: Secondary | ICD-10-CM | POA: Diagnosis not present

## 2017-06-29 DIAGNOSIS — Z8673 Personal history of transient ischemic attack (TIA), and cerebral infarction without residual deficits: Secondary | ICD-10-CM | POA: Diagnosis not present

## 2017-06-29 DIAGNOSIS — M25552 Pain in left hip: Secondary | ICD-10-CM | POA: Diagnosis not present

## 2017-07-19 DIAGNOSIS — R9089 Other abnormal findings on diagnostic imaging of central nervous system: Secondary | ICD-10-CM | POA: Diagnosis not present

## 2017-07-19 DIAGNOSIS — R269 Unspecified abnormalities of gait and mobility: Secondary | ICD-10-CM | POA: Diagnosis not present

## 2017-07-19 DIAGNOSIS — M19072 Primary osteoarthritis, left ankle and foot: Secondary | ICD-10-CM | POA: Diagnosis not present

## 2017-07-19 DIAGNOSIS — M79672 Pain in left foot: Secondary | ICD-10-CM | POA: Diagnosis not present

## 2017-07-19 DIAGNOSIS — Z1389 Encounter for screening for other disorder: Secondary | ICD-10-CM | POA: Diagnosis not present

## 2017-07-19 DIAGNOSIS — R2689 Other abnormalities of gait and mobility: Secondary | ICD-10-CM | POA: Diagnosis not present

## 2017-07-20 DIAGNOSIS — T63304A Toxic effect of unspecified spider venom, undetermined, initial encounter: Secondary | ICD-10-CM | POA: Diagnosis not present

## 2017-07-20 DIAGNOSIS — Z79899 Other long term (current) drug therapy: Secondary | ICD-10-CM | POA: Diagnosis not present

## 2017-07-20 DIAGNOSIS — M25552 Pain in left hip: Secondary | ICD-10-CM | POA: Diagnosis not present

## 2017-07-20 DIAGNOSIS — R358 Other polyuria: Secondary | ICD-10-CM | POA: Diagnosis not present

## 2017-07-20 DIAGNOSIS — N3001 Acute cystitis with hematuria: Secondary | ICD-10-CM | POA: Diagnosis not present

## 2017-07-20 DIAGNOSIS — Z681 Body mass index (BMI) 19 or less, adult: Secondary | ICD-10-CM | POA: Diagnosis not present

## 2017-07-20 DIAGNOSIS — E7800 Pure hypercholesterolemia, unspecified: Secondary | ICD-10-CM | POA: Diagnosis not present

## 2017-07-27 DIAGNOSIS — M25551 Pain in right hip: Secondary | ICD-10-CM | POA: Diagnosis not present

## 2017-07-27 DIAGNOSIS — F3340 Major depressive disorder, recurrent, in remission, unspecified: Secondary | ICD-10-CM | POA: Diagnosis not present

## 2017-07-27 DIAGNOSIS — F0390 Unspecified dementia without behavioral disturbance: Secondary | ICD-10-CM | POA: Diagnosis not present

## 2017-07-27 DIAGNOSIS — M25552 Pain in left hip: Secondary | ICD-10-CM | POA: Diagnosis not present

## 2017-07-27 DIAGNOSIS — M79671 Pain in right foot: Secondary | ICD-10-CM | POA: Diagnosis not present

## 2017-07-27 DIAGNOSIS — L989 Disorder of the skin and subcutaneous tissue, unspecified: Secondary | ICD-10-CM | POA: Diagnosis not present

## 2017-07-27 DIAGNOSIS — R55 Syncope and collapse: Secondary | ICD-10-CM | POA: Diagnosis not present

## 2017-07-27 DIAGNOSIS — Z681 Body mass index (BMI) 19 or less, adult: Secondary | ICD-10-CM | POA: Diagnosis not present

## 2017-07-27 DIAGNOSIS — R269 Unspecified abnormalities of gait and mobility: Secondary | ICD-10-CM | POA: Diagnosis not present

## 2017-07-29 DIAGNOSIS — Z681 Body mass index (BMI) 19 or less, adult: Secondary | ICD-10-CM | POA: Diagnosis not present

## 2017-07-29 DIAGNOSIS — M25552 Pain in left hip: Secondary | ICD-10-CM | POA: Diagnosis not present

## 2017-07-29 DIAGNOSIS — M25551 Pain in right hip: Secondary | ICD-10-CM | POA: Diagnosis not present

## 2017-08-01 DIAGNOSIS — M160 Bilateral primary osteoarthritis of hip: Secondary | ICD-10-CM | POA: Diagnosis not present

## 2017-08-01 DIAGNOSIS — M25552 Pain in left hip: Secondary | ICD-10-CM | POA: Diagnosis not present

## 2017-08-01 DIAGNOSIS — M25551 Pain in right hip: Secondary | ICD-10-CM | POA: Diagnosis not present

## 2017-08-01 DIAGNOSIS — T84050A Periprosthetic osteolysis of internal prosthetic right hip joint, initial encounter: Secondary | ICD-10-CM | POA: Diagnosis not present

## 2017-08-03 DIAGNOSIS — F0390 Unspecified dementia without behavioral disturbance: Secondary | ICD-10-CM | POA: Diagnosis not present

## 2017-08-03 DIAGNOSIS — D485 Neoplasm of uncertain behavior of skin: Secondary | ICD-10-CM | POA: Diagnosis not present

## 2017-08-03 DIAGNOSIS — C44722 Squamous cell carcinoma of skin of right lower limb, including hip: Secondary | ICD-10-CM | POA: Diagnosis not present

## 2017-08-03 DIAGNOSIS — D0472 Carcinoma in situ of skin of left lower limb, including hip: Secondary | ICD-10-CM | POA: Diagnosis not present

## 2017-08-05 DIAGNOSIS — Z681 Body mass index (BMI) 19 or less, adult: Secondary | ICD-10-CM | POA: Diagnosis not present

## 2017-08-05 DIAGNOSIS — R55 Syncope and collapse: Secondary | ICD-10-CM | POA: Diagnosis not present

## 2017-08-05 DIAGNOSIS — F0390 Unspecified dementia without behavioral disturbance: Secondary | ICD-10-CM | POA: Diagnosis not present

## 2017-08-05 DIAGNOSIS — G629 Polyneuropathy, unspecified: Secondary | ICD-10-CM | POA: Diagnosis not present

## 2017-08-08 DIAGNOSIS — B351 Tinea unguium: Secondary | ICD-10-CM | POA: Diagnosis not present

## 2017-08-08 DIAGNOSIS — L600 Ingrowing nail: Secondary | ICD-10-CM | POA: Diagnosis not present

## 2017-08-19 DIAGNOSIS — R319 Hematuria, unspecified: Secondary | ICD-10-CM | POA: Diagnosis not present

## 2017-08-19 DIAGNOSIS — R300 Dysuria: Secondary | ICD-10-CM | POA: Diagnosis not present

## 2017-08-19 DIAGNOSIS — N3000 Acute cystitis without hematuria: Secondary | ICD-10-CM | POA: Diagnosis not present

## 2017-08-19 DIAGNOSIS — Z681 Body mass index (BMI) 19 or less, adult: Secondary | ICD-10-CM | POA: Diagnosis not present

## 2017-08-31 DIAGNOSIS — G629 Polyneuropathy, unspecified: Secondary | ICD-10-CM | POA: Diagnosis not present

## 2017-09-02 DIAGNOSIS — F0390 Unspecified dementia without behavioral disturbance: Secondary | ICD-10-CM | POA: Diagnosis not present

## 2017-09-02 DIAGNOSIS — Z681 Body mass index (BMI) 19 or less, adult: Secondary | ICD-10-CM | POA: Diagnosis not present

## 2017-09-02 DIAGNOSIS — Z79899 Other long term (current) drug therapy: Secondary | ICD-10-CM | POA: Diagnosis not present

## 2017-09-02 DIAGNOSIS — I472 Ventricular tachycardia: Secondary | ICD-10-CM | POA: Diagnosis not present

## 2017-09-02 DIAGNOSIS — R55 Syncope and collapse: Secondary | ICD-10-CM | POA: Diagnosis not present

## 2017-09-02 DIAGNOSIS — F3340 Major depressive disorder, recurrent, in remission, unspecified: Secondary | ICD-10-CM | POA: Diagnosis not present

## 2017-09-02 DIAGNOSIS — G629 Polyneuropathy, unspecified: Secondary | ICD-10-CM | POA: Diagnosis not present

## 2017-09-02 DIAGNOSIS — R269 Unspecified abnormalities of gait and mobility: Secondary | ICD-10-CM | POA: Diagnosis not present

## 2017-09-05 DIAGNOSIS — F0390 Unspecified dementia without behavioral disturbance: Secondary | ICD-10-CM | POA: Diagnosis not present

## 2017-09-09 DIAGNOSIS — M25551 Pain in right hip: Secondary | ICD-10-CM | POA: Diagnosis not present

## 2017-09-12 DIAGNOSIS — R55 Syncope and collapse: Secondary | ICD-10-CM | POA: Diagnosis not present

## 2017-09-12 DIAGNOSIS — I472 Ventricular tachycardia: Secondary | ICD-10-CM | POA: Diagnosis not present

## 2017-09-12 DIAGNOSIS — Z681 Body mass index (BMI) 19 or less, adult: Secondary | ICD-10-CM | POA: Diagnosis not present

## 2017-09-12 DIAGNOSIS — E7800 Pure hypercholesterolemia, unspecified: Secondary | ICD-10-CM | POA: Diagnosis not present

## 2017-09-12 DIAGNOSIS — M25551 Pain in right hip: Secondary | ICD-10-CM | POA: Diagnosis not present

## 2017-09-14 DIAGNOSIS — M25551 Pain in right hip: Secondary | ICD-10-CM | POA: Diagnosis not present

## 2017-09-21 DIAGNOSIS — M25551 Pain in right hip: Secondary | ICD-10-CM | POA: Diagnosis not present

## 2017-09-28 DIAGNOSIS — Z79899 Other long term (current) drug therapy: Secondary | ICD-10-CM | POA: Diagnosis not present

## 2017-09-28 DIAGNOSIS — M25551 Pain in right hip: Secondary | ICD-10-CM | POA: Diagnosis not present
